# Patient Record
Sex: Female | Born: 1995 | Race: Black or African American | Hispanic: No | Marital: Single | Smoking: Never smoker
Health system: Southern US, Community
[De-identification: ages and names within clinical notes are randomized; demographics above are authoritative.]

## PROBLEM LIST (undated history)

## (undated) HISTORY — PX: FOOT SURGERY: SHX648

## (undated) HISTORY — PX: THERAPEUTIC ABORTION: SHX798

---
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
## 2013-02-11 ENCOUNTER — Ambulatory Visit: Payer: Self-pay | Admitting: Physician Assistant

## 2015-05-07 NOTE — L&D Delivery Note (Signed)
Delivery Note At  a viable and healthy female was delivered via  (Presentation:ROA   ).  APGAR:8 ,9 ; weight  pending.   Placenta status: intact, 3V Cord:  with the following complications:True knot in cord x2.  Thick mec .   Anesthesia:  Epideral Lacerations:  None Est. Blood Loss (mL): 300cc Uterus boggy, external massage given and 1065mcg Cytotec rectally.    Mom to postpartum.  Baby to Couplet care / Skin to Skin.  Destiny Stuart Yulissa Needham 03/08/2016, 10:29 AM

## 2015-08-22 LAB — OB RESULTS CONSOLE GC/CHLAMYDIA: Chlamydia: NEGATIVE

## 2015-08-29 LAB — OB RESULTS CONSOLE GC/CHLAMYDIA: Gonorrhea: NEGATIVE

## 2015-08-29 LAB — OB RESULTS CONSOLE HIV ANTIBODY (ROUTINE TESTING): HIV: NONREACTIVE

## 2015-08-29 LAB — OB RESULTS CONSOLE HEPATITIS B SURFACE ANTIGEN: Hepatitis B Surface Ag: NEGATIVE

## 2015-08-29 LAB — OB RESULTS CONSOLE RPR: RPR: NONREACTIVE

## 2015-08-29 LAB — OB RESULTS CONSOLE ABO/RH: RH Type: POSITIVE

## 2015-08-29 LAB — OB RESULTS CONSOLE ANTIBODY SCREEN: Antibody Screen: NEGATIVE

## 2016-02-01 LAB — OB RESULTS CONSOLE GBS: GBS: NEGATIVE

## 2016-03-01 ENCOUNTER — Telehealth (HOSPITAL_COMMUNITY): Payer: Self-pay | Admitting: *Deleted

## 2016-03-01 ENCOUNTER — Encounter (HOSPITAL_COMMUNITY): Payer: Self-pay | Admitting: *Deleted

## 2016-03-01 NOTE — Telephone Encounter (Signed)
Preadmission screen  

## 2016-03-04 ENCOUNTER — Encounter (HOSPITAL_COMMUNITY): Payer: Self-pay | Admitting: *Deleted

## 2016-03-04 ENCOUNTER — Telehealth (HOSPITAL_COMMUNITY): Payer: Self-pay | Admitting: *Deleted

## 2016-03-04 NOTE — Telephone Encounter (Signed)
Preadmission screen  

## 2016-03-07 ENCOUNTER — Encounter (HOSPITAL_COMMUNITY): Payer: Self-pay | Admitting: *Deleted

## 2016-03-07 ENCOUNTER — Inpatient Hospital Stay (HOSPITAL_COMMUNITY)
Admission: AD | Admit: 2016-03-07 | Discharge: 2016-03-10 | DRG: 775 | Disposition: A | Discharge status: Home / Self Care | Payer: Federal, State, Local not specified - PPO | Source: Ambulatory Visit | Attending: Obstetrics and Gynecology | Admitting: Obstetrics and Gynecology

## 2016-03-07 ENCOUNTER — Inpatient Hospital Stay (HOSPITAL_COMMUNITY): Payer: Federal, State, Local not specified - PPO | Admitting: Anesthesiology

## 2016-03-07 DIAGNOSIS — O480 Post-term pregnancy: Secondary | ICD-10-CM | POA: Diagnosis present

## 2016-03-07 DIAGNOSIS — O1414 Severe pre-eclampsia complicating childbirth: Secondary | ICD-10-CM | POA: Diagnosis present

## 2016-03-07 DIAGNOSIS — Z3A41 41 weeks gestation of pregnancy: Secondary | ICD-10-CM | POA: Diagnosis not present

## 2016-03-07 DIAGNOSIS — O1490 Unspecified pre-eclampsia, unspecified trimester: Secondary | ICD-10-CM | POA: Diagnosis present

## 2016-03-07 LAB — COMPREHENSIVE METABOLIC PANEL
ALBUMIN: 2.9 g/dL — AB (ref 3.5–5.0)
ALT: 6 U/L — ABNORMAL LOW (ref 14–54)
AST: 19 U/L (ref 15–41)
Alkaline Phosphatase: 55 U/L (ref 38–126)
Anion gap: 9 (ref 5–15)
BUN: 6 mg/dL (ref 6–20)
CHLORIDE: 105 mmol/L (ref 101–111)
CO2: 21 mmol/L — ABNORMAL LOW (ref 22–32)
Calcium: 8.8 mg/dL — ABNORMAL LOW (ref 8.9–10.3)
Creatinine, Ser: 0.65 mg/dL (ref 0.44–1.00)
GFR calc Af Amer: >60 mL/min (ref >60)
GLUCOSE: 101 mg/dL — AB (ref 65–99)
POTASSIUM: 3.5 mmol/L (ref 3.5–5.1)
Sodium: 135 mmol/L (ref 135–145)
Total Bilirubin: 0.7 mg/dL (ref 0.3–1.2)
Total Protein: 6.4 g/dL — ABNORMAL LOW (ref 6.5–8.1)

## 2016-03-07 LAB — RAPID URINE DRUG SCREEN, HOSP PERFORMED
Amphetamines: NOT DETECTED
Barbiturates: NOT DETECTED
Benzodiazepines: NOT DETECTED
COCAINE: NOT DETECTED
OPIATES: NOT DETECTED
Tetrahydrocannabinol: NOT DETECTED

## 2016-03-07 LAB — URINE MICROSCOPIC-ADD ON
RBC / HPF: NONE SEEN RBC/hpf (ref 0–5)
WBC UA: NONE SEEN WBC/hpf (ref 0–5)

## 2016-03-07 LAB — CBC
HCT: 30.6 % — ABNORMAL LOW (ref 36.0–46.0)
HCT: 31.2 % — ABNORMAL LOW (ref 36.0–46.0)
Hemoglobin: 10.1 g/dL — ABNORMAL LOW (ref 12.0–15.0)
Hemoglobin: 10.3 g/dL — ABNORMAL LOW (ref 12.0–15.0)
MCH: 25.4 pg — ABNORMAL LOW (ref 26.0–34.0)
MCH: 25.6 pg — ABNORMAL LOW (ref 26.0–34.0)
MCHC: 33 g/dL (ref 30.0–36.0)
MCHC: 33 g/dL (ref 30.0–36.0)
MCV: 77.1 fL — ABNORMAL LOW (ref 78.0–100.0)
MCV: 77.4 fL — AB (ref 78.0–100.0)
PLATELETS: 285 10*3/uL (ref 150–400)
Platelets: 319 10*3/uL (ref 150–400)
RBC: 3.97 MIL/uL (ref 3.87–5.11)
RBC: 4.03 MIL/uL (ref 3.87–5.11)
RDW: 13.9 % (ref 11.5–15.5)
RDW: 13.9 % (ref 11.5–15.5)
WBC: 15.3 10*3/uL — AB (ref 4.0–10.5)
WBC: 15.8 10*3/uL — AB (ref 4.0–10.5)

## 2016-03-07 LAB — URIC ACID: URIC ACID, SERUM: 5 mg/dL (ref 2.3–6.6)

## 2016-03-07 LAB — URINALYSIS, ROUTINE W REFLEX MICROSCOPIC
BILIRUBIN URINE: NEGATIVE
GLUCOSE, UA: NEGATIVE mg/dL
Ketones, ur: NEGATIVE mg/dL
Leukocytes, UA: NEGATIVE
Nitrite: NEGATIVE
PH: 7 (ref 5.0–8.0)
Protein, ur: NEGATIVE mg/dL

## 2016-03-07 LAB — TYPE AND SCREEN
ABO/RH(D): O POS
ANTIBODY SCREEN: NEGATIVE

## 2016-03-07 LAB — PROTEIN / CREATININE RATIO, URINE
Creatinine, Urine: 63 mg/dL
PROTEIN CREATININE RATIO: 0.1 mg/mg{creat} (ref 0.00–0.15)
Total Protein, Urine: 6 mg/dL

## 2016-03-07 LAB — OB RESULTS CONSOLE RUBELLA ANTIBODY, IGM: Rubella: IMMUNE

## 2016-03-07 LAB — ABO/RH: ABO/RH(D): O POS

## 2016-03-07 MED ORDER — FENTANYL CITRATE (PF) 100 MCG/2ML IJ SOLN
100.0000 ug | INTRAMUSCULAR | Status: DC | PRN
  Administered 2016-03-07: 100 ug via INTRAVENOUS
  Filled 2016-03-07: qty 2

## 2016-03-07 MED ORDER — EPHEDRINE 5 MG/ML INJ
10.0000 mg | INTRAVENOUS | Status: DC | PRN
  Filled 2016-03-07: qty 4

## 2016-03-07 MED ORDER — PHENYLEPHRINE 40 MCG/ML (10ML) SYRINGE FOR IV PUSH (FOR BLOOD PRESSURE SUPPORT)
80.0000 ug | PREFILLED_SYRINGE | INTRAVENOUS | Status: DC | PRN
Start: 2016-03-07 — End: 2016-03-08
  Filled 2016-03-07: qty 5
  Filled 2016-03-07: qty 10

## 2016-03-07 MED ORDER — OXYTOCIN BOLUS FROM INFUSION
500.0000 mL | Freq: Once | INTRAVENOUS | Status: AC
  Administered 2016-03-08: 500 mL via INTRAVENOUS

## 2016-03-07 MED ORDER — LACTATED RINGERS IV SOLN
500.0000 mL | Freq: Once | INTRAVENOUS | Status: AC
  Administered 2016-03-07: 500 mL via INTRAVENOUS

## 2016-03-07 MED ORDER — ONDANSETRON HCL 4 MG/2ML IJ SOLN: 4.0000 mg | Freq: Four times a day (QID) | INTRAMUSCULAR | Status: DC | PRN

## 2016-03-07 MED ORDER — SOD CITRATE-CITRIC ACID 500-334 MG/5ML PO SOLN: 30.0000 mL | ORAL | Status: DC | PRN

## 2016-03-07 MED ORDER — HYDRALAZINE HCL 20 MG/ML IJ SOLN
10.0000 mg | Freq: Once | INTRAMUSCULAR | Status: DC | PRN
  Filled 2016-03-07: qty 1

## 2016-03-07 MED ORDER — OXYTOCIN 40 UNITS IN LACTATED RINGERS INFUSION - SIMPLE MED
2.5000 [IU]/h | INTRAVENOUS | Status: DC
  Filled 2016-03-07: qty 1000

## 2016-03-07 MED ORDER — LACTATED RINGERS IV SOLN
INTRAVENOUS | Status: DC
  Administered 2016-03-07: 12:00:00 via INTRAVENOUS

## 2016-03-07 MED ORDER — LABETALOL HCL 5 MG/ML IV SOLN
20.0000 mg | INTRAVENOUS | Status: AC | PRN
  Administered 2016-03-07: 20 mg via INTRAVENOUS
  Administered 2016-03-07: 40 mg via INTRAVENOUS
  Administered 2016-03-08: 20 mg via INTRAVENOUS
  Filled 2016-03-07: qty 4
  Filled 2016-03-07: qty 12

## 2016-03-07 MED ORDER — MAGNESIUM SULFATE 50 % IJ SOLN
2.0000 g/h | INTRAVENOUS | Status: DC
  Administered 2016-03-08: 2 g/h via INTRAVENOUS
  Filled 2016-03-07 (×2): qty 80

## 2016-03-07 MED ORDER — TERBUTALINE SULFATE 1 MG/ML IJ SOLN: 0.2500 mg | Freq: Once | INTRAMUSCULAR | Status: DC | PRN

## 2016-03-07 MED ORDER — LACTATED RINGERS IV SOLN: 500.0000 mL | INTRAVENOUS | Status: DC | PRN

## 2016-03-07 MED ORDER — LABETALOL HCL 5 MG/ML IV SOLN
INTRAVENOUS | Status: AC
  Administered 2016-03-07: 20 mg via INTRAVENOUS
  Filled 2016-03-07: qty 16

## 2016-03-07 MED ORDER — OXYCODONE-ACETAMINOPHEN 5-325 MG PO TABS: 2.0000 | ORAL_TABLET | ORAL | Status: DC | PRN

## 2016-03-07 MED ORDER — MAGNESIUM SULFATE BOLUS VIA INFUSION
4.0000 g | Freq: Once | INTRAVENOUS | Status: AC
  Administered 2016-03-07: 4 g via INTRAVENOUS
  Filled 2016-03-07: qty 500

## 2016-03-07 MED ORDER — OXYCODONE-ACETAMINOPHEN 5-325 MG PO TABS
1.0000 | ORAL_TABLET | ORAL | Status: DC | PRN
  Filled 2016-03-07: qty 1

## 2016-03-07 MED ORDER — FENTANYL 2.5 MCG/ML BUPIVACAINE 1/10 % EPIDURAL INFUSION (WH - ANES)
14.0000 mL/h | INTRAMUSCULAR | Status: DC | PRN
  Administered 2016-03-07 – 2016-03-08 (×3): 14 mL/h via EPIDURAL
  Filled 2016-03-07 (×3): qty 100

## 2016-03-07 MED ORDER — LABETALOL HCL 5 MG/ML IV SOLN
20.0000 mg | INTRAVENOUS | Status: AC | PRN
Start: 2016-03-07 — End: 2016-03-07
  Administered 2016-03-07: 20 mg via INTRAVENOUS
  Administered 2016-03-07: 40 mg via INTRAVENOUS
  Administered 2016-03-07: 80 mg via INTRAVENOUS
  Filled 2016-03-07: qty 8
  Filled 2016-03-07: qty 16
  Filled 2016-03-07: qty 4

## 2016-03-07 MED ORDER — ACETAMINOPHEN 325 MG PO TABS
650.0000 mg | ORAL_TABLET | ORAL | Status: DC | PRN
  Administered 2016-03-07 (×2): 650 mg via ORAL
  Filled 2016-03-07 (×2): qty 2

## 2016-03-07 MED ORDER — DIPHENHYDRAMINE HCL 50 MG/ML IJ SOLN
12.5000 mg | INTRAMUSCULAR | Status: AC | PRN
  Administered 2016-03-07 – 2016-03-08 (×3): 12.5 mg via INTRAVENOUS
  Filled 2016-03-07: qty 1

## 2016-03-07 MED ORDER — HYDRALAZINE HCL 20 MG/ML IJ SOLN
10.0000 mg | Freq: Once | INTRAMUSCULAR | Status: AC | PRN
  Administered 2016-03-07: 10 mg via INTRAVENOUS
  Filled 2016-03-07: qty 1

## 2016-03-07 MED ORDER — LIDOCAINE HCL (PF) 1 % IJ SOLN
INTRAMUSCULAR | Status: DC | PRN
  Administered 2016-03-07: 4 mL
  Administered 2016-03-07: 6 mL via EPIDURAL

## 2016-03-07 MED ORDER — MISOPROSTOL 25 MCG QUARTER TABLET
25.0000 ug | ORAL_TABLET | ORAL | Status: DC | PRN
  Administered 2016-03-07: 25 ug via VAGINAL
  Filled 2016-03-07: qty 1
  Filled 2016-03-07: qty 0.25

## 2016-03-07 MED ORDER — LIDOCAINE HCL (PF) 1 % IJ SOLN
30.0000 mL | INTRAMUSCULAR | Status: DC | PRN
  Filled 2016-03-07: qty 30

## 2016-03-07 MED ORDER — PHENYLEPHRINE 40 MCG/ML (10ML) SYRINGE FOR IV PUSH (FOR BLOOD PRESSURE SUPPORT)
80.0000 ug | PREFILLED_SYRINGE | INTRAVENOUS | Status: DC | PRN
  Filled 2016-03-07: qty 10
  Filled 2016-03-07: qty 5

## 2016-03-07 MED ORDER — OXYCODONE-ACETAMINOPHEN 5-325 MG PO TABS
1.0000 | ORAL_TABLET | Freq: Once | ORAL | Status: AC
  Administered 2016-03-07: 1 via ORAL

## 2016-03-07 NOTE — MAU Note (Signed)
Pt presents to MAU with complaints of contractions that started around 2 this morning. Denies any vaginal bleeding or LOF. Reports baby is active

## 2016-03-07 NOTE — Anesthesia Procedure Notes (Signed)

## 2016-03-07 NOTE — Progress Notes (Signed)
S: received 1 dose of Cytotec, now contracting every 1-3 minutes, 40-50 seconds, 6/10  O: Required 3 doses of Labetalol and 1 dose of Hydralazine to get BP below parameters earlier and now BP rising again with pain      VE: 2+ / 80/ vertex/ -2  Very posterior      Fetal tracings: Category 1  A: Pre-eclampsia with severe features and all normal labs now in early labor  P: SVD expected     Continue current plan. Pain management

## 2016-03-07 NOTE — Anesthesia Preprocedure Evaluation (Signed)
Anesthesia Evaluation  Patient identified by MRN, date of birth, ID band Patient awake    Reviewed: Allergy & Precautions, H&P , Patient's Chart, lab work & pertinent test results  Airway Mallampati: II  TM Distance: >3 FB Neck ROM: full    Dental  (+) Teeth Intact   Pulmonary    breath sounds clear to auscultation       Cardiovascular hypertension,  Rhythm:regular Rate:Normal     Neuro/Psych    GI/Hepatic   Endo/Other    Renal/GU      Musculoskeletal   Abdominal   Peds  Hematology   Anesthesia Other Findings   PIH, Rx'd with labatolol and apresoline    Reproductive/Obstetrics (+) Pregnancy                             Anesthesia Physical Anesthesia Plan  ASA: II  Anesthesia Plan: Epidural   Post-op Pain Management:    Induction:   Airway Management Planned:   Additional Equipment:   Intra-op Plan:   Post-operative Plan:   Informed Consent: I have reviewed the patients History and Physical, chart, labs and discussed the procedure including the risks, benefits and alternatives for the proposed anesthesia with the patient or authorized representative who has indicated his/her understanding and acceptance.   Dental Advisory Given  Plan Discussed with:   Anesthesia Plan Comments: (Labs checked- platelets confirmed with RN in room. Fetal heart tracing, per RN, reported to be stable enough for sitting procedure. Discussed epidural, and patient consents to the procedure:  included risk of possible headache,backache, failed block, allergic reaction, and nerve injury. This patient was asked if she had any questions or concerns before the procedure started.)        Anesthesia Quick Evaluation

## 2016-03-07 NOTE — Anesthesia Pain Management Evaluation Note (Signed)
  CRNA Pain Management Visit Note  Patient: Destiny Stuart, 20 y.o., female  "Hello I am a member of the anesthesia team at [facility]. We have an anesthesia team available at all times to provide care throughout the hospital, including epidural management and anesthesia for C-section. I don't know your plan for the delivery whether it a natural birth, water birth, IV sedation, nitrous supplementation, doula or epidural, but we want to meet your pain goals."   1.Was your pain managed to your expectations on prior hospitalizations?   No prior hospitalizations  2.What is your expectation for pain management during this hospitalization?     Labor support without medications, Epidural, IV pain meds and Nitrous Oxide  3.How can we help you reach that goal? Pt first pregnancy. She is unsure what she wants tot use for pain control but was willing to discuss all options  Record the patient's initial score and the patient's pain goal.   Pain: 6  Pain Goal: 8 The [facility] wants you to be able to say your pain was always managed very well.  Nickson Middlesworth 03/07/2016

## 2016-03-07 NOTE — Progress Notes (Signed)
Dr Cletis Media notified of pt's VE, BP, contractions pattern and FHR tracing. Orders received to admit pt

## 2016-03-07 NOTE — H&P (Signed)
Destiny Stuart is a 20 y.o. female ,G1P0,presenting for labor check.Contractions are irregular. Cervical exam is still closed. BP elevated to severe range. Patient denies headache, visual symptoms and epigastric pain. She reports good FM.   Pregnancy followed at [facility] since 33+  Weeks, transferred of care from [place],  and remarkable for:  1. Reported cannabis use discontinued at +UPT with positive drug screen at initial OB labs 2. 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 EDD confirmed by early ultrasound 4. 20 week normal anatomy scan with posterior placenta  OB History    Gravida Para Term Preterm AB Living   1             SAB TAB Ectopic Multiple Live Births                 History reviewed. No pertinent past medical history. Past Surgical History:  Procedure Laterality Date  . FOOT SURGERY      Family History:   non-contributory  Social History:    reports that she has never smoked. She has never used smokeless tobacco. She reports that she does not drink alcohol or use drugs.   Prenatal labs: ABO, Rh: O/Positive/-- (04/25 0000) Antibody: Negative (04/25 0000) Rubella: immune RPR: Nonreactive (04/25 0000)  HBsAg: Negative (04/25 0000)  HIV: Non-reactive (04/25 0000)  GBS: Negative (09/28 0000)    Prenatal Transfer Tool  Maternal Diabetes: No Genetic Screening: Declined Maternal Ultrasounds/Referrals: Normal Fetal Ultrasounds or other Referrals:  None Maternal Substance Abuse:  Yes:  Type: Marijuana discontinued in early pregnancy Significant Maternal Medications:  None Significant Maternal Lab Results: None   Dilation: Closed Effacement (%): 50 Blood pressure (!) 176/105, pulse 117, temperature 98.4 F (36.9 C), resp. rate 18, height 5\' 8"  (1.727 m), weight 213 lb (96.6 kg), last menstrual period 05/25/2015.  General Appearance: Alert,  appropriate appearance for age. No acute distress HEENT Exam: Grossly normal Chest/Respiratory Exam: Normal chest wall and respirations. Clear to auscultation  Cardiovascular Exam: Regular rate and rhythm. S1, S2, no murmur Gastrointestinal Exam: soft, non-tender, Uterus gravid with size compatible with GA, Vertex presentation by Leopold's maneuvers Psychiatric Exam: Alert and oriented, appropriate affect Extremities: minimal edema, normal DTR and no clonus  ++++++++++++++++++++++++++++++++++++++++++++++++++++++++++++++++  Vaginal exam: c/50/-2 vertex  Fetal tracings: Category 1, contractions every 2-4 minutes, 5/10 intensity  ++++++++++++++++++++++++++++++++++++++++++++++++++++++++++++++++   Assessment/Plan:  41 weeks Severe pre-eclampsia Admit for IOL: cervical ripening with Cytotec Treat high range BP per protocol Start magnesium Sulfate All findings and plan of care were reviewed with patient and mother. Questions answered   Delsa Bern MD 03/07/2016, 11:16 AM

## 2016-03-07 NOTE — Progress Notes (Addendum)
Mattilynn I Alas is a 20 y.o. G1P0 at [redacted]w[redacted]d admitted for induction of labor due to Pre-eclampsia of pregnancy..  Subjective:  Comfortable with  Epidural Contractions every 1-2 minutes, lasting 45-60 seconds    Objective: BP (!) 152/92   Pulse 96   Temp 98.8 F (37.1 C) (Oral)   Resp 18   Ht 5\' 8"  (1.727 m)   Wt 213 lb (96.6 kg)   LMP 05/25/2015   SpO2 100%   BMI 32.39 kg/m  I/O last 3 completed shifts: In: 1815.4 [P.O.:420; I.V.:1395.4] Out: 2100 [Urine:2100] Total I/O In: 427.8 [P.O.:150; I.V.:250; Other:27.8] Out: 525 [Urine:525]  FHT:  FHR: 130 bpm, variability: moderate,  accelerations:  Abscent,  decelerations:   SVE:   Dilation: 3 Effacement (%): 80 Station: -2 Exam by:: Lori C. CNM  Labs: Lab Results  Component Value Date   WBC 15.8 (H) 03/07/2016   HGB 10.3 (L) 03/07/2016   HCT 31.2 (L) 03/07/2016   MCV 77.4 (L) 03/07/2016   PLT 319 03/07/2016    Assessment / Plan: PreEclampsia Induction of labor Magnesium Sulfate 2g/hr Arom- Thick mec IUPC placed Epidural Update to Dr  Nelda Marseille Fetal Wellbeing: reassuring Anticipated MOD:  NSVD  Lori A Clemmons 03/07/2016, 9:59 PM

## 2016-03-08 ENCOUNTER — Encounter (HOSPITAL_COMMUNITY): Payer: Self-pay | Admitting: *Deleted

## 2016-03-08 LAB — CBC
HEMATOCRIT: 29.5 % — AB (ref 36.0–46.0)
HEMOGLOBIN: 9.8 g/dL — AB (ref 12.0–15.0)
MCH: 25.9 pg — AB (ref 26.0–34.0)
MCHC: 33.2 g/dL (ref 30.0–36.0)
MCV: 77.8 fL — AB (ref 78.0–100.0)
PLATELETS: 295 10*3/uL (ref 150–400)
RBC: 3.79 MIL/uL — AB (ref 3.87–5.11)
RDW: 14.3 % (ref 11.5–15.5)
WBC: 19.7 10*3/uL — AB (ref 4.0–10.5)

## 2016-03-08 LAB — RPR: RPR Ser Ql: NONREACTIVE

## 2016-03-08 MED ORDER — ACETAMINOPHEN 325 MG PO TABS: 650.0000 mg | ORAL_TABLET | ORAL | Status: DC | PRN

## 2016-03-08 MED ORDER — ZOLPIDEM TARTRATE 5 MG PO TABS: 5.0000 mg | ORAL_TABLET | Freq: Every evening | ORAL | Status: DC | PRN

## 2016-03-08 MED ORDER — OXYTOCIN 40 UNITS IN LACTATED RINGERS INFUSION - SIMPLE MED
1.0000 m[IU]/min | INTRAVENOUS | Status: DC
  Administered 2016-03-08: 1 m[IU]/min via INTRAVENOUS
  Administered 2016-03-08: 7 m[IU]/min via INTRAVENOUS

## 2016-03-08 MED ORDER — TETANUS-DIPHTH-ACELL PERTUSSIS 5-2.5-18.5 LF-MCG/0.5 IM SUSP: 0.5000 mL | Freq: Once | INTRAMUSCULAR | Status: DC

## 2016-03-08 MED ORDER — MAGNESIUM SULFATE 50 % IJ SOLN
2.0000 g/h | INTRAVENOUS | Status: AC
  Administered 2016-03-08: 2 g/h via INTRAVENOUS
  Filled 2016-03-08: qty 80

## 2016-03-08 MED ORDER — ONDANSETRON HCL 4 MG PO TABS: 4.0000 mg | ORAL_TABLET | ORAL | Status: DC | PRN

## 2016-03-08 MED ORDER — IBUPROFEN 600 MG PO TABS
600.0000 mg | ORAL_TABLET | Freq: Four times a day (QID) | ORAL | Status: DC
  Administered 2016-03-08 – 2016-03-10 (×8): 600 mg via ORAL
  Filled 2016-03-08 (×8): qty 1

## 2016-03-08 MED ORDER — COCONUT OIL OIL: 1.0000 "application " | TOPICAL_OIL | Status: DC | PRN

## 2016-03-08 MED ORDER — DIPHENHYDRAMINE HCL 25 MG PO CAPS: 25.0000 mg | ORAL_CAPSULE | Freq: Four times a day (QID) | ORAL | Status: DC | PRN

## 2016-03-08 MED ORDER — LABETALOL HCL 5 MG/ML IV SOLN
20.0000 mg | INTRAVENOUS | Status: DC | PRN
  Administered 2016-03-08: 80 mg via INTRAVENOUS
  Administered 2016-03-08: 40 mg via INTRAVENOUS
  Filled 2016-03-08: qty 16

## 2016-03-08 MED ORDER — WITCH HAZEL-GLYCERIN EX PADS: 1.0000 "application " | MEDICATED_PAD | CUTANEOUS | Status: DC | PRN

## 2016-03-08 MED ORDER — SENNOSIDES-DOCUSATE SODIUM 8.6-50 MG PO TABS
2.0000 | ORAL_TABLET | ORAL | Status: DC
  Administered 2016-03-08 – 2016-03-10 (×3): 2 via ORAL
  Filled 2016-03-08 (×3): qty 2

## 2016-03-08 MED ORDER — HYDRALAZINE HCL 20 MG/ML IJ SOLN: 10.0000 mg | Freq: Once | INTRAMUSCULAR | Status: DC | PRN

## 2016-03-08 MED ORDER — PRENATAL MULTIVITAMIN CH
1.0000 | ORAL_TABLET | Freq: Every day | ORAL | Status: DC
  Administered 2016-03-09: 1 via ORAL
  Filled 2016-03-08: qty 1

## 2016-03-08 MED ORDER — LABETALOL HCL 5 MG/ML IV SOLN
INTRAVENOUS | Status: AC
  Administered 2016-03-08: 40 mg via INTRAVENOUS
  Filled 2016-03-08: qty 8

## 2016-03-08 MED ORDER — BENZOCAINE-MENTHOL 20-0.5 % EX AERO
1.0000 "application " | INHALATION_SPRAY | CUTANEOUS | Status: DC | PRN
  Administered 2016-03-08: 1 via TOPICAL
  Filled 2016-03-08: qty 56

## 2016-03-08 MED ORDER — MISOPROSTOL 200 MCG PO TABS
ORAL_TABLET | ORAL | Status: AC
  Filled 2016-03-08: qty 5

## 2016-03-08 MED ORDER — DIBUCAINE 1 % RE OINT: 1.0000 "application " | TOPICAL_OINTMENT | RECTAL | Status: DC | PRN

## 2016-03-08 MED ORDER — SIMETHICONE 80 MG PO CHEW
80.0000 mg | CHEWABLE_TABLET | ORAL | Status: DC | PRN
Start: 2016-03-08 — End: 2016-03-10

## 2016-03-08 MED ORDER — ONDANSETRON HCL 4 MG/2ML IJ SOLN: 4.0000 mg | INTRAMUSCULAR | Status: DC | PRN

## 2016-03-08 MED ORDER — MISOPROSTOL 200 MCG PO TABS
1000.0000 ug | ORAL_TABLET | Freq: Once | ORAL | Status: AC
  Administered 2016-03-08: 1000 ug via RECTAL

## 2016-03-08 NOTE — Lactation Note (Signed)
This note was copied from a baby's chart. Lactation Consultation Note  Patient Name: Destiny Stuart Today's Date: 03/08/2016 Reason for consult: Follow-up assessment  Baby 5 hours old. Mom had originally states that she wanted to pump and bottle-feed EBM--per mom's bedside RN, Lavella Lemons and [facility] note at initial consultation. Mom reports that her mother and grandmother helped her to latch the baby earlier, and she now wants to nurse in the hospital--and then nurse and pump at home. Offered to assist with latching baby and mom agreed. Assisted with latching baby to right breast in football position. Baby's temp is low normal per CN RN, Misty. Baby latched deeply to right breast and suckled rhythmically with a few swallows for several minutes. Demonstrated to mom how to stimulate baby to keep nurse. Baby nursed off-and-on for 10 minutes. Enc mom to keep offering STS and attempts at breast with cues. Enc mom to call out for assistance as needed. Discussed assessment and interventions with patient's RN, Lavella Lemons.  Maternal Data    Feeding Feeding Type: Breast Fed Length of feed:  (LC assessed first 10 minutes of BF.)  LATCH Score/Interventions Latch: Grasps breast easily, tongue down, lips flanged, rhythmical sucking.  Audible Swallowing: A few with stimulation Intervention(s): Skin to skin;Hand expression  Type of Nipple: Everted at rest and after stimulation (short shaft.)  Comfort (Breast/Nipple): Soft / non-tender     Hold (Positioning): Assistance needed to correctly position infant at breast and maintain latch. Intervention(s): Breastfeeding basics reviewed;Support Pillows;Position options;Skin to skin  LATCH Score: 8  Lactation Tools Discussed/Used     Consult Status Consult Status: Follow-up Date: 03/09/16 Follow-up type: In-patient    Andres Labrum 03/08/2016, 4:23 PM

## 2016-03-08 NOTE — Lactation Note (Signed)
This note was copied from a baby's chart. Lactation Consultation Note  Patient Name: Destiny Stuart Today's Date: 03/08/2016 Reason for consult: Initial assessment   Initial assessment with forst time mom of< 1 hour old infant.  Infant STS with mom and rooting to feed. Assisted mom in latching infant to both breasts without success. Mom with flat nipples and thick areola. Infant biting and has tongue pulled back in mouth when suckling on gloved finger. Hand expressed 1 cc Colostrum and spoon fed to infant. Mom asked me to stop BF and give a bottle. Enc mom to BF 8-12 x in 24 hours at first feeding cues and that BF should occur prior to formula feeding to establish a good supply. GM asked if mom could pump and bottle feed, told her yes and pump can be set up on Post Partum unit.   Feeding Log, BF Resources Handout and LC Brochure given, mom informed of IP/OP Services, BF Support Groups and [place] phone #. Enc mom to call out to desk for feeding assistance as needed. Follow up tomorrow and prn.    Maternal Data Formula Feeding for Exclusion: Yes Reason for exclusion: Mother's choice to formula and breast feed on admission Does the patient have breastfeeding experience prior to this delivery?: No  Feeding Feeding Type: Breast Fed Length of feed: 0 min  LATCH Score/Interventions Latch: Too sleepy or reluctant, no latch achieved, no sucking elicited. Intervention(s): Skin to skin  Audible Swallowing: None Intervention(s): Hand expression  Type of Nipple: Flat  Comfort (Breast/Nipple): Soft / non-tender     Hold (Positioning): Assistance needed to correctly position infant at breast and maintain latch. Intervention(s): Breastfeeding basics reviewed;Support Pillows;Position options;Skin to skin  LATCH Score: 4  Lactation Tools Discussed/Used WIC Program: No   Consult Status Consult Status: Follow-up Date: 03/08/16 Follow-up type: In-patient    Debby Freiberg Symphany Fleissner 03/08/2016,  11:37 AM

## 2016-03-09 ENCOUNTER — Inpatient Hospital Stay (HOSPITAL_COMMUNITY): Admission: RE | Admit: 2016-03-09 | Payer: Self-pay | Source: Ambulatory Visit

## 2016-03-09 LAB — CBC
HCT: 25.6 % — ABNORMAL LOW (ref 36.0–46.0)
Hemoglobin: 8.7 g/dL — ABNORMAL LOW (ref 12.0–15.0)
MCH: 26.3 pg (ref 26.0–34.0)
MCHC: 34 g/dL (ref 30.0–36.0)
MCV: 77.3 fL — ABNORMAL LOW (ref 78.0–100.0)
PLATELETS: 286 10*3/uL (ref 150–400)
RBC: 3.31 MIL/uL — ABNORMAL LOW (ref 3.87–5.11)
RDW: 14.5 % (ref 11.5–15.5)
WBC: 15.1 10*3/uL — ABNORMAL HIGH (ref 4.0–10.5)

## 2016-03-09 MED ORDER — NIFEDIPINE ER OSMOTIC RELEASE 30 MG PO TB24
60.0000 mg | ORAL_TABLET | Freq: Every day | ORAL | Status: DC
  Administered 2016-03-10: 60 mg via ORAL
  Filled 2016-03-09: qty 2

## 2016-03-09 MED ORDER — NIFEDIPINE ER OSMOTIC RELEASE 30 MG PO TB24
30.0000 mg | ORAL_TABLET | Freq: Once | ORAL | Status: AC
  Administered 2016-03-09: 30 mg via ORAL
  Filled 2016-03-09: qty 1

## 2016-03-09 MED ORDER — NIFEDIPINE ER OSMOTIC RELEASE 30 MG PO TB24
30.0000 mg | ORAL_TABLET | Freq: Every day | ORAL | Status: DC
  Administered 2016-03-09: 30 mg via ORAL
  Filled 2016-03-09: qty 1

## 2016-03-09 NOTE — Lactation Note (Signed)
This note was copied from a baby's chart. Lactation Consultation Note  Patient Name: Destiny Stuart Today's Date: 03/09/2016 Reason for consult: Follow-up assessment  Baby 71 hours old. Mom nursing baby in cradle position when this [place] entered the room. Baby latched deeply with lips flanged, suckling rhythmically with intermittent swallows noted. Mom reports that she re-latches baby if baby's latch is too shallow. Enc mom to continue nursing with cues, and discussed progression of milk coming to volume. Discussed engorgement prevention and treatment as well. Mom aware of OP/BFSG and [place] phone line assistance after D/C.   Maternal Data    Feeding Feeding Type: Breast Fed Length of feed: 25 min  LATCH Score/Interventions Latch: Grasps breast easily, tongue down, lips flanged, rhythmical sucking.  Audible Swallowing: Spontaneous and intermittent  Type of Nipple: Flat  Comfort (Breast/Nipple): Soft / non-tender     Hold (Positioning): No assistance needed to correctly position infant at breast.  LATCH Score: 9  Lactation Tools Discussed/Used     Consult Status Consult Status: Follow-up Date: 03/10/16 Follow-up type: In-patient    Andres Labrum 03/09/2016, 1:49 PM

## 2016-03-09 NOTE — Progress Notes (Signed)
Subjective: Postpartum Day 1: Vaginal delivery,  Patient up ad lib, reports no syncope or dizziness.  Magnesium will be turned off at 1030.  BP still 150/90s Feeding:  Breastfeeding Contraceptive plan:  undecided  Objective: Vital signs in last 24 hours: Temp:  [98 F (36.7 C)-98.9 F (37.2 C)] 98.4 F (36.9 C) (11/04 0927) Pulse Rate:  [88-111] 101 (11/04 0927) Resp:  [15-18] 18 (11/04 0927) BP: (114-169)/(87-131) 158/90 (11/04 0927) SpO2:  [98 %-100 %] 100 % (11/04 0927) Weight:  [95.8 kg (211 lb 3 oz)] 95.8 kg (211 lb 3 oz) (11/04 0525)  Physical Exam:  General: alert, cooperative and no distress Lochia: appropriate Uterine Fundus: firm Perineum: Intact DVT Evaluation: No evidence of DVT seen on physical exam. Negative Homan's sign.   CBC Latest Ref Rng & Units 03/09/2016 03/08/2016 03/07/2016  WBC 4.0 - 10.5 K/uL 15.1(H) 19.7(H) 15.8(H)  Hemoglobin 12.0 - 15.0 g/dL 8.7(L) 9.8(L) 10.3(L)  Hematocrit 36.0 - 46.0 % 25.6(L) 29.5(L) 31.2(L)  Platelets 150 - 400 K/uL 286 295 319     Assessment/Plan: Status post vaginal delivery day 1.  Preclampsia Stable Continue current care. Plan for discharge tomorrow and Breastfeeding  Will start on Procardia 30 XL    Pleas Koch ProtheroCNM 03/09/2016, 11:00 AM

## 2016-03-09 NOTE — Progress Notes (Signed)
Irene Shipper, CNM notified of BP 167/98.  Orders received to increase Procardia.  See orders.

## 2016-03-09 NOTE — Anesthesia Postprocedure Evaluation (Signed)
Anesthesia Post Note  Patient: Destiny Stuart  Procedure(s) Performed: * No procedures listed *  Patient location during evaluation: [facility] Unit Anesthesia Type: Epidural Level of consciousness: awake and alert, oriented and patient cooperative Pain management: pain level controlled Vital Signs Assessment: post-procedure vital signs reviewed and stable Respiratory status: spontaneous breathing Cardiovascular status: stable Postop Assessment: no headache, epidural receding, patient able to bend at knees and no signs of nausea or vomiting Anesthetic complications: no Comments: Stated pain score 1.     Last Vitals:  Vitals:   03/09/16 0155 03/09/16 0525  BP: (!) 153/90 (!) 158/87  Pulse: 88 (!) 101  Resp: 15 16  Temp: 36.7 C 37.2 C    Last Pain:  Vitals:   03/09/16 0525  TempSrc: Oral  PainSc:    Pain Goal: Patients Stated Pain Goal: 3 (03/09/16 0155)               Rico Sheehan

## 2016-03-10 MED ORDER — NIFEDIPINE ER OSMOTIC RELEASE 60 MG PO TB24
60.0000 mg | ORAL_TABLET | Freq: Every day | ORAL | 1 refills | Status: DC
Start: 2016-03-10 — End: 2018-10-14

## 2016-03-10 MED ORDER — PRENATAL MULTIVITAMIN CH: 1.0000 | ORAL_TABLET | Freq: Every day | ORAL | 1 refills | Status: DC

## 2016-03-10 NOTE — Discharge Summary (Signed)
OB Discharge Summary     Patient Name: Destiny Stuart DOB: 1995-12-10 MRN: [id number]  Date of admission: 03/07/2016 Delivering MD: Starla Link   Date of discharge: 03/10/2016  Admitting diagnosis: 41WKS,LABOR Intrauterine pregnancy: [redacted]w[redacted]d     Secondary diagnosis:  Active Problems:   Pre-eclampsia   SVD (spontaneous vaginal delivery)  Additional problems: None     Discharge diagnosis: Term Pregnancy Delivered and Preeclampsia (severe)                                                                                                Post partum procedures:Procardia 60mg  XL  Augmentation: AROM and Pitocin  Complications: None  Hospital course:  Induction of Labor With Vaginal Delivery   20 y.o. yo G1P1001 at [redacted]w[redacted]d was admitted to the hospital 03/07/2016 for induction of labor.  Indication for induction: Postdates and Preeclampsia.  Patient had an uncomplicated labor course as follows: Membrane Rupture Time/Date: 8:47 PM ,03/07/2016   Intrapartum Procedures: Episiotomy: None [1]                                         Lacerations:  None [1]  Patient had delivery of a Viable female infant.  Information for the patient's newborn:  Szymczak, Boy Niko [id number]  Delivery Method: Vag-Spont   03/08/2016  Details of delivery can be found in separate delivery note.  Patient had a routine postpartum course. Patient is discharged home 03/10/16.   Physical exam Vitals:   03/09/16 1754 03/09/16 2231 03/10/16 0110 03/10/16 0525  BP: (!) 167/98 (!) 157/89 (!) 158/92 140/85  Pulse: (!) 109 100 (!) 112 (!) 106  Resp: 18 18 18 18   Temp: 98.5 F (36.9 C) 98 F (36.7 C) 98.4 F (36.9 C) 98.3 F (36.8 C)  TempSrc: Oral Oral Oral Oral  SpO2: 100% 100% 99% 99%  Weight:    89.4 kg (197 lb)  Height:       General: alert, cooperative and no distress Lochia: appropriate Uterine Fundus: firm Incision: N/A DVT Evaluation: Negative Homan's sign. No significant calf/ankle  edema. Labs: Lab Results  Component Value Date   WBC 15.1 (H) 03/09/2016   HGB 8.7 (L) 03/09/2016   HCT 25.6 (L) 03/09/2016   MCV 77.3 (L) 03/09/2016   PLT 286 03/09/2016   CMP Latest Ref Rng & Units 03/07/2016  Glucose 65 - 99 mg/dL 101(H)  BUN 6 - 20 mg/dL 6  Creatinine 0.44 - 1.00 mg/dL 0.65  Sodium 135 - 145 mmol/L 135  Potassium 3.5 - 5.1 mmol/L 3.5  Chloride 101 - 111 mmol/L 105  CO2 22 - 32 mmol/L 21(L)  Calcium 8.9 - 10.3 mg/dL 8.8(L)  Total Protein 6.5 - 8.1 g/dL 6.4(L)  Total Bilirubin 0.3 - 1.2 mg/dL 0.7  Alkaline Phos 38 - 126 U/L 55  AST 15 - 41 U/L 19  ALT 14 - 54 U/L 6(L)    Discharge instruction: per After Visit Summary and "Baby and Me Booklet".  After visit meds:  Medication List    STOP taking these medications   pseudoephedrine 30 MG tablet Commonly known as:  SUDAFED     TAKE these medications   calcium carbonate 500 MG chewable tablet Commonly known as:  TUMS - dosed in mg elemental calcium Chew 3-4 tablets by mouth as needed for indigestion or heartburn.   clobetasol 0.05 % external solution Commonly known as:  TEMOVATE Apply 1 application topically 2 (two) times daily as needed (for irritation).   NIFEdipine 60 MG 24 hr tablet Commonly known as:  PROCARDIA XL/ADALAT-CC Take 1 tablet (60 mg total) by mouth daily.   prenatal multivitamin Tabs tablet Take 1 tablet by mouth daily at 12 noon.   triamcinolone ointment 0.1 % Commonly known as:  KENALOG Apply 1 application topically 2 (two) times daily as needed (for irritation).       Diet: routine diet  Activity: Advance as tolerated. Pelvic rest for 6 weeks.   Outpatient follow up:1 week for BP check Follow up Appt:No future appointments. Follow up Visit:1 week for BP check Postpartum contraception: Undecided  Newborn Data: Live born female  Birth Weight: 7 lb 12.7 oz (3535 g) APGAR: 9, 9  Baby Feeding: Breast Disposition:home with mother   03/10/2016 Starla Link,  CNM

## 2016-03-10 NOTE — Clinical Social Work Maternal (Signed)
CLINICAL SOCIAL WORK MATERNAL/CHILD NOTE  Patient Details  Name: Destiny Stuart MRN: [id number] Date of Birth: 10/14/1995  Date:  03/10/2016  Clinical Social Worker Initiating Note:  Valton Schwartz, MSW, LCSW-A   Date/ Time Initiated:  03/10/16/1038              Child's Name:  As'siyah Rison   Legal Guardian:  Other (Comment) (Not established by court system; MOB is single parenting)   Need for Interpreter:  None   Date of Referral:  03/08/16     Reason for Referral:  Current Substance Use/Substance Use During Pregnancy    Referral Source:  Physician   Address:  [address] [place], [place] [address]  Phone number:  [phone]   Household Members: Self, Parents   Natural Supports (not living in the home): Immediate Family   Professional Supports:None   Employment:Unemployed   Type of Work: Unemployed at the moment    Education:  9 to 11 years   Financial Resources:Medicaid, Private Insurance ([employer] employee PPO & Medicaid )   Other Resources:     Cultural/Religious Considerations Which May Impact Care: None stated at this time.   Strengths: Ability to meet basic needs , Compliance with medical plan , Home prepared for child    Risk Factors/Current Problems: Substance Use , Other (Comment) (MOB expressed to this writer feeling veyr overwhelmed and tired at the moment )   Cognitive State: Able to Concentrate , Alert , Insightful , Goal Oriented    Mood/Affect: Interested , Overwhelmed    CSW Assessment:CSW met with MOB at bedside to complete assessment. At this time, MOB had three visitors in the room in which she identified as her mother, father and grandmother. This writer asked guest to step out of room in order to provide MOB with privacy in responses to answers. Upon guest leaving the room, this writer noted to MOB her role and reasoning for visit being due to her hx of substance use. At this time, MOB  noted she would like to continue assessment in private.   MOB confirmed hx of substance use and notes her last use was probably in June. This writer informed MOB of hospitals policy and procedure regarding substance. This writer noted to MOB that a UDS and cord blood test have been ordered for baby; however, results have not come back yet. This writer explained to MOB that in the event either of these results are positive, a report will be made to [place] Department [facility] Social Services. MOB verbalized understanding.   This writer observed that MOB appeared to be very restricted in her responses to this writer engagement. This writer inquired how MOB is feeling emotionally and she noted "overwhelmed and tired. I have not slept since having him". This writer discussed PPD and SIDS and the importance of sleep and proper nutrition upon d/c. Additionally, this writer encouraged MOB to call on her supports when needed as the above mentioned feelings are all normal in regards to her present state after delivering. MOB noted she feels she has a good support system made up of the three individuals who are present today. MOB notes FOB is not involved.   This writer inquired if MOB has thought about child care in the event she finds employment. MOB notes she will work around her parents schedule when seeking employment in hopes that they will care for baby while she is working. This writer informed MOB of [place] Resources available that   can assist her in getting things in order for baby's arrival home and future needs. MOB noted she would like resources but no direct referrals at this time.   This writer provided MOB with resources for CC4C, Parenting 0-5 and Healthy Start Program. MOB was very receptive to all resources. At this time, no other needs addressed or requested. CSW will continue to follow baby's pending UDS and cord blood test results.   CSW Plan/Description: Information/Referral to  Community Resources    Joselynne Killam, MSW, LCSW-A Clinical Social Worker  [facility]  Office: [phone]   

## 2016-03-10 NOTE — Lactation Note (Signed)
This note was copied from a baby's chart. Lactation Consultation Note  Patient Name: [name] Today's Date: 03/10/2016 Reason for consult: Follow-up assessment  Visited with Mom and GMOB on day of discharge, baby 67 hrs old.  Baby at 4.6% weight loss, 4 voids, 0 stools in last 24 hrs (5 stools first 24 hrs). Baby crying and picture person in room.   Offered to assist with positioning and support.  GMOB stated that baby is always wanting to BF.  Explained about normal newborn feeding patterns and importance of offering breast if baby is fussing.   Mom latched baby in cross cradle hold easily.  Showed Mom how to un-tuck lower lip to help with a deeper areolar grasp.  Multiple swallowing identified.  Breasts with some fullness in upper outer quadrant.  Demonstrated how to use breast massage during feeding to help these areas soften.  Engorgement prevention and treatment discussed.  Encouraged STS and cue based feedings with a goal of 8-12 feedings per 24 hrs.  Reviewed normal cluster pattern of baby's feedings. Reminded Mom of OP lactation services provided.  Encouraged Mom to call for any questions.   Consult Status Consult Status: Complete Date: 03/10/16 Follow-up type: Call as needed    Destiny Stuart 03/10/2016, 9:56 AM

## 2016-03-10 NOTE — Progress Notes (Signed)
Discharge instructions reviewed with patient.  Patient states understanding of home care for self and baby, medications, activity, signs/symptoms to report to MD and return MD office visit for both.  Patients significant other and family will assist with her care @ home.  No home  equipment needed, patient has prescriptions and all personal belongings.  Patient ambulated for discharge in stable condition with staff without incident.  Baby discharged into care of mother.

## 2016-03-14 NOTE — Progress Notes (Signed)
CSW made a report to [facility] [place] worker, Wendall Stade, for infant's positive cord for [facility].  CPS will follow up with MOB.   Laurey Arrow, MSW, LCSW Clinical Social Work [phone]

## 2016-05-27 ENCOUNTER — Encounter: Payer: Self-pay | Admitting: Emergency Medicine

## 2016-05-27 ENCOUNTER — Observation Stay (HOSPITAL_COMMUNITY)
Admission: EM | Admit: 2016-05-27 | Discharge: 2016-05-30 | Disposition: A | Discharge status: Home / Self Care | Payer: Federal, State, Local not specified - PPO | Attending: Family Medicine | Admitting: Family Medicine

## 2016-05-27 ENCOUNTER — Emergency Department (HOSPITAL_COMMUNITY): Payer: Federal, State, Local not specified - PPO

## 2016-05-27 DIAGNOSIS — K8020 Calculus of gallbladder without cholecystitis without obstruction: Secondary | ICD-10-CM

## 2016-05-27 DIAGNOSIS — D75839 Thrombocytosis, unspecified: Secondary | ICD-10-CM

## 2016-05-27 DIAGNOSIS — R748 Abnormal levels of other serum enzymes: Secondary | ICD-10-CM | POA: Diagnosis present

## 2016-05-27 DIAGNOSIS — O1493 Unspecified pre-eclampsia, third trimester: Secondary | ICD-10-CM | POA: Diagnosis not present

## 2016-05-27 DIAGNOSIS — K838 Other specified diseases of biliary tract: Secondary | ICD-10-CM | POA: Diagnosis present

## 2016-05-27 DIAGNOSIS — E049 Nontoxic goiter, unspecified: Secondary | ICD-10-CM | POA: Insufficient documentation

## 2016-05-27 DIAGNOSIS — D473 Essential (hemorrhagic) thrombocythemia: Secondary | ICD-10-CM | POA: Diagnosis not present

## 2016-05-27 DIAGNOSIS — R7303 Prediabetes: Secondary | ICD-10-CM | POA: Diagnosis not present

## 2016-05-27 DIAGNOSIS — K8080 Other cholelithiasis without obstruction: Secondary | ICD-10-CM

## 2016-05-27 DIAGNOSIS — R1011 Right upper quadrant pain: Secondary | ICD-10-CM | POA: Diagnosis present

## 2016-05-27 DIAGNOSIS — O1490 Unspecified pre-eclampsia, unspecified trimester: Secondary | ICD-10-CM | POA: Diagnosis present

## 2016-05-27 DIAGNOSIS — K8064 Calculus of gallbladder and bile duct with chronic cholecystitis without obstruction: Principal | ICD-10-CM | POA: Insufficient documentation

## 2016-05-27 DIAGNOSIS — K8050 Calculus of bile duct without cholangitis or cholecystitis without obstruction: Secondary | ICD-10-CM

## 2016-05-27 LAB — URINALYSIS, ROUTINE W REFLEX MICROSCOPIC
BACTERIA UA: NONE SEEN
GLUCOSE, UA: NEGATIVE mg/dL
HGB URINE DIPSTICK: NEGATIVE
Ketones, ur: 5 mg/dL — AB
LEUKOCYTES UA: NEGATIVE
NITRITE: NEGATIVE
PROTEIN: 30 mg/dL — AB
Specific Gravity, Urine: 1.023 (ref 1.005–1.030)
pH: 6 (ref 5.0–8.0)

## 2016-05-27 LAB — CBC
HEMATOCRIT: 37.6 % (ref 36.0–46.0)
HEMOGLOBIN: 11.6 g/dL — AB (ref 12.0–15.0)
MCH: 22.4 pg — ABNORMAL LOW (ref 26.0–34.0)
MCHC: 30.9 g/dL (ref 30.0–36.0)
MCV: 72.6 fL — ABNORMAL LOW (ref 78.0–100.0)
Platelets: 410 10*3/uL — ABNORMAL HIGH (ref 150–400)
RBC: 5.18 MIL/uL — AB (ref 3.87–5.11)
RDW: 15.5 % (ref 11.5–15.5)
WBC: 9.2 10*3/uL (ref 4.0–10.5)

## 2016-05-27 LAB — COMPREHENSIVE METABOLIC PANEL
ALBUMIN: 4.3 g/dL (ref 3.5–5.0)
ALT: 293 U/L — ABNORMAL HIGH (ref 14–54)
ANION GAP: 11 (ref 5–15)
AST: 485 U/L — ABNORMAL HIGH (ref 15–41)
Alkaline Phosphatase: 153 U/L — ABNORMAL HIGH (ref 38–126)
BILIRUBIN TOTAL: 2.2 mg/dL — AB (ref 0.3–1.2)
BUN: 7 mg/dL (ref 6–20)
CO2: 28 mmol/L (ref 22–32)
Calcium: 9.9 mg/dL (ref 8.9–10.3)
Chloride: 101 mmol/L (ref 101–111)
Creatinine, Ser: 0.99 mg/dL (ref 0.44–1.00)
GFR calc non Af Amer: >60 mL/min (ref >60)
GLUCOSE: 115 mg/dL — AB (ref 65–99)
POTASSIUM: 3.6 mmol/L (ref 3.5–5.1)
Sodium: 140 mmol/L (ref 135–145)
TOTAL PROTEIN: 8 g/dL (ref 6.5–8.1)

## 2016-05-27 LAB — LIPASE, BLOOD: Lipase: 21 U/L (ref 11–51)

## 2016-05-27 MED ORDER — ONDANSETRON HCL 4 MG PO TABS
4.0000 mg | ORAL_TABLET | Freq: Four times a day (QID) | ORAL | Status: DC | PRN
Start: 2016-05-27 — End: 2016-05-30

## 2016-05-27 MED ORDER — ACETAMINOPHEN 650 MG RE SUPP: 650.0000 mg | Freq: Four times a day (QID) | RECTAL | Status: DC | PRN

## 2016-05-27 MED ORDER — FENTANYL CITRATE (PF) 100 MCG/2ML IJ SOLN
25.0000 ug | INTRAMUSCULAR | Status: DC | PRN
  Administered 2016-05-28: 100 ug via INTRAVENOUS
  Administered 2016-05-29 (×3): 25 ug via INTRAVENOUS
  Filled 2016-05-27 (×3): qty 2

## 2016-05-27 MED ORDER — ENOXAPARIN SODIUM 40 MG/0.4ML ~~LOC~~ SOLN: 40.0000 mg | Freq: Once | SUBCUTANEOUS | Status: DC

## 2016-05-27 MED ORDER — SODIUM CHLORIDE 0.9 % IV SOLN
INTRAVENOUS | Status: DC
  Administered 2016-05-27 – 2016-05-29 (×5): via INTRAVENOUS

## 2016-05-27 MED ORDER — PRENATAL MULTIVITAMIN CH
1.0000 | ORAL_TABLET | Freq: Every day | ORAL | Status: DC
  Filled 2016-05-27 (×3): qty 1

## 2016-05-27 MED ORDER — ACETAMINOPHEN 325 MG PO TABS: 650.0000 mg | ORAL_TABLET | Freq: Four times a day (QID) | ORAL | Status: DC | PRN

## 2016-05-27 MED ORDER — HEPARIN SODIUM (PORCINE) 5000 UNIT/ML IJ SOLN
5000.0000 [IU] | Freq: Three times a day (TID) | INTRAMUSCULAR | Status: AC
  Administered 2016-05-27: 5000 [IU] via SUBCUTANEOUS
  Filled 2016-05-27: qty 1

## 2016-05-27 MED ORDER — HYDRALAZINE HCL 20 MG/ML IJ SOLN: 10.0000 mg | Freq: Four times a day (QID) | INTRAMUSCULAR | Status: DC | PRN

## 2016-05-27 MED ORDER — KETOROLAC TROMETHAMINE 15 MG/ML IJ SOLN: 15.0000 mg | Freq: Four times a day (QID) | INTRAMUSCULAR | Status: DC | PRN

## 2016-05-27 MED ORDER — POLYETHYLENE GLYCOL 3350 17 G PO PACK: 17.0000 g | PACK | Freq: Every day | ORAL | Status: DC | PRN

## 2016-05-27 MED ORDER — ONDANSETRON HCL 4 MG/2ML IJ SOLN
4.0000 mg | Freq: Four times a day (QID) | INTRAMUSCULAR | Status: DC | PRN
  Filled 2016-05-27: qty 2

## 2016-05-27 NOTE — Consult Note (Signed)
Unassigned patient Reason for Consult: Abnormal LFT's with gallstones. Referring Physician: THP  Destiny Stuart is an 21 y.o. female.  HPI:  Destiny Stuart is a 21 year old black female who presented to the [facility] Emergency room with a two-week history of epigastric pain nausea and vomiting. She is 3 months postpartum. Over the last 2 weeks she been having worsening epigastric pain radiating to her back. She's had some anorexia as well she denies a history of hematemesis melena or melena. There is no history of fever chills or rigors. In the ER she was found to have a 10.1 mm common bile duct with cholelithiasis on an abdominal ultrasound. Her total bilirubin is 2.2 with elevated AST/ALT and Alkaline phosphatase. There is no evidence of cholecystitis. She is otherwise healthy she had some problems with preeclampsia during her pregnancy.     History reviewed. No pertinent past medical history.  Past Surgical History:  Procedure Laterality Date  . FOOT SURGERY     No family history on file.  Social History:  reports that she has never smoked. She has never used smokeless tobacco. She reports that she does not drink alcohol or use drugs. She is single and works for [employer] she just started her job recently   Allergies:  Allergies  Allergen Reactions  . Iodine Hives  . Latex Hives  . Peanut-Containing Drug Products Hives   Medications: I have reviewed the patient's current medications.  Results for orders placed or performed during the hospital encounter of 05/27/16 (from the past 48 hour(s))  Lipase, blood     Status: None   Collection Time: 05/27/16  7:48 AM  Result Value Ref Range   Lipase 21 11 - 51 U/L  Comprehensive metabolic panel     Status: Abnormal   Collection Time: 05/27/16  7:48 AM  Result Value Ref Range   Sodium 140 135 - 145 mmol/L   Potassium 3.6 3.5 - 5.1 mmol/L   Chloride 101 101 - 111 mmol/L   CO2 28 22 - 32 mmol/L   Glucose, Bld 115 (H) 65 - 99  mg/dL   BUN 7 6 - 20 mg/dL   Creatinine, Ser 0.99 0.44 - 1.00 mg/dL   Calcium 9.9 8.9 - 10.3 mg/dL   Total Protein 8.0 6.5 - 8.1 g/dL   Albumin 4.3 3.5 - 5.0 g/dL   AST 485 (H) 15 - 41 U/L   ALT 293 (H) 14 - 54 U/L   Alkaline Phosphatase 153 (H) 38 - 126 U/L   Total Bilirubin 2.2 (H) 0.3 - 1.2 mg/dL   GFR calc non Af Amer >60 >60 mL/min   GFR calc Af Amer >60 >60 mL/min    Comment: (NOTE) The eGFR has been calculated using the CKD EPI equation. This calculation has not been validated in all clinical situations. eGFR's persistently <60 mL/min signify possible Chronic Kidney Disease.    Anion gap 11 5 - 15  CBC     Status: Abnormal   Collection Time: 05/27/16  7:48 AM  Result Value Ref Range   WBC 9.2 4.0 - 10.5 K/uL   RBC 5.18 (H) 3.87 - 5.11 MIL/uL   Hemoglobin 11.6 (L) 12.0 - 15.0 g/dL   HCT 37.6 36.0 - 46.0 %   MCV 72.6 (L) 78.0 - 100.0 fL   MCH 22.4 (L) 26.0 - 34.0 pg   MCHC 30.9 30.0 - 36.0 g/dL   RDW 15.5 11.5 - 15.5 %   Platelets 410 (H) 150 -  400 K/uL  Urinalysis, Routine w reflex microscopic     Status: Abnormal   Collection Time: 05/27/16  7:53 AM  Result Value Ref Range   Color, Urine AMBER (A) YELLOW    Comment: BIOCHEMICALS MAY BE AFFECTED BY COLOR   APPearance CLEAR CLEAR   Specific Gravity, Urine 1.023 1.005 - 1.030   pH 6.0 5.0 - 8.0   Glucose, UA NEGATIVE NEGATIVE mg/dL   Hgb urine dipstick NEGATIVE NEGATIVE   Bilirubin Urine MODERATE (A) NEGATIVE   Ketones, ur 5 (A) NEGATIVE mg/dL   Protein, ur 30 (A) NEGATIVE mg/dL   Nitrite NEGATIVE NEGATIVE   Leukocytes, UA NEGATIVE NEGATIVE   RBC / HPF 0-5 0 - 5 RBC/hpf   WBC, UA 0-5 0 - 5 WBC/hpf   Bacteria, UA NONE SEEN NONE SEEN   Squamous Epithelial / LPF 0-5 (A) NONE SEEN   Mucous PRESENT    US Abdomen Limited Ruq  Result Date: 05/27/2016 CLINICAL DATA:  Right upper quadrant abdominal pain, nausea, vomiting and diarrhea for the past 3 weeks. EXAM: US ABDOMEN LIMITED - RIGHT UPPER QUADRANT COMPARISON:   None. FINDINGS: Gallbladder: Multiple gallstones in the gallbladder measuring up to 8 mm in maximum diameter each. No gallbladder wall thickening or pericholecystic fluid. No sonographic Murphy's sign. Common bile duct: Diameter: 10.1 mm Liver: No focal lesion identified. Within normal limits in parenchymal echogenicity. IMPRESSION: 1. Cholelithiasis without evidence of cholecystitis. 2. Dilated common duct. This is suspicious for a nonvisualized distal common duct stone or stones. A nonvisualized mass or stricture is less likely. Electronically Signed   By: Claudie Revering M.D.   On: 05/27/2016 12:56    Review of Systems  Constitutional: Negative.   HENT: Negative.   Eyes: Negative.   Respiratory: Negative.   Cardiovascular: Negative.   Gastrointestinal: Positive for abdominal pain, nausea and vomiting. Negative for blood in stool, constipation, diarrhea, heartburn and melena.  Genitourinary: Negative.   Musculoskeletal: Negative.   Skin: Negative.   Neurological: Negative.   Endo/Heme/Allergies: Negative.   Psychiatric/Behavioral: Negative.    Blood pressure 130/84, pulse 101, temperature 98 F (36.7 C), temperature source Oral, resp. rate 16, height 5' 8"  (1.727 m), weight 83.6 kg (184 lb 6 oz), last menstrual period 05/27/2016, SpO2 100 %, not currently breastfeeding. Physical Exam  Constitutional: She is oriented to person, place, and time. She appears well-developed and well-nourished.  HENT:  Head: Normocephalic and atraumatic.  Eyes: Conjunctivae and EOM are normal. Pupils are equal, round, and reactive to light.  Neck: Normal range of motion. Neck supple.  Cardiovascular: Normal rate and regular rhythm.   Respiratory: Effort normal and breath sounds normal.  GI: Soft. Bowel sounds are normal. She exhibits no mass. There is tenderness. There is no rebound and no guarding.  Musculoskeletal: Normal range of motion.  Neurological: She is alert and oriented to person, place, and time.   Skin: Skin is warm and dry.  Psychiatric: She has a normal mood and affect. Her behavior is normal. Judgment and thought content normal.   Assessment/Plan: 1) Cholelithiasis with a dilated common bile duct and abnormal LFTs- AN ERCP is planned for tomorrow appreciate surgical consultation for the recommendation made thereafter.   Starleen Trussell 05/27/2016, 3:40 PM

## 2016-05-27 NOTE — ED Provider Notes (Signed)
[facility] DEPT Provider Note   CSN: [id number] Arrival date & time: 05/27/16  [id number]     History   Chief Complaint No chief complaint on file.   HPI Atavia I Klemmer is a 21 y.o. female.  HPI   Pt with PMH of SVDelivery November 2016 comes to the ER for ruq/epigastric abdominal pain. The pain has been intermittent, waxing and waning at times severe. The pain that brought her to the ER this morning was severe and she reports that she was crying. She denies ETOH intake, denies. Tylenol or trying to use any medications at all. She has been having some associated vomiting. These episodes have been going on for 2-3 weeks. Currently she is only having mild discomfort and declines pain medications. Deneis hx of gallbladder/pancreatic disease or this happening to her in the past.  History reviewed. No pertinent past medical history.  Patient Active Problem List   Diagnosis Date Noted  . Elevated liver enzymes 05/27/2016  . Dilated bile duct 05/27/2016  . SVD (spontaneous vaginal delivery) 03/08/2016  . Pre-eclampsia 03/07/2016  . Pre-diabetes 10/22/2010  . Goiter 10/22/2010    Past Surgical History:  Procedure Laterality Date  . FOOT SURGERY      OB History    Gravida Para Term Preterm AB Living   1 1 1     1    SAB TAB Ectopic Multiple Live Births         0 1       Home Medications    Prior to Admission medications   Medication Sig Start Date End Date Taking? Authorizing Provider  calcium carbonate (TUMS - DOSED IN MG ELEMENTAL CALCIUM) 500 MG chewable tablet Chew 3-4 tablets by mouth as needed for indigestion or heartburn.    Historical Provider, MD  clobetasol (TEMOVATE) 0.05 % external solution Apply 1 application topically 2 (two) times daily as needed (for irritation).     Historical Provider, MD  NIFEdipine (PROCARDIA XL/ADALAT-CC) 60 MG 24 hr tablet Take 1 tablet (60 mg total) by mouth daily. 03/10/16   Starla Link, CNM  Prenatal Vit-Fe Fumarate-FA  (PRENATAL MULTIVITAMIN) TABS tablet Take 1 tablet by mouth daily at 12 noon. 03/10/16   Starla Link, CNM  triamcinolone ointment (KENALOG) 0.1 % Apply 1 application topically 2 (two) times daily as needed (for irritation).    Historical Provider, MD    Family History No family history on file.  Social History Social History  Substance Use Topics  . Smoking status: Never Smoker  . Smokeless tobacco: Never Used  . Alcohol use No     Allergies   Iodine; Latex; and Peanut-containing drug products   Review of Systems Review of Systems Review of Systems All other systems negative except as documented in the HPI. All pertinent positives and negatives as reviewed in the HPI.   Physical Exam Updated Vital Signs BP 124/83 (BP Location: Left Arm)   Pulse 93   Temp 98.4 F (36.9 C) (Oral)   Resp 18   Ht 5\' 8"  (1.727 m)   Wt 83.6 kg   LMP 05/27/2016 (Approximate)   SpO2 100%   Breastfeeding? No   BMI 28.03 kg/m   Physical Exam  Constitutional: She appears well-developed and well-nourished. No distress.  HENT:  Head: Normocephalic and atraumatic.  Right Ear: Tympanic membrane and ear canal normal.  Left Ear: Tympanic membrane and ear canal normal.  Nose: Nose normal.  Mouth/Throat: Uvula is midline, oropharynx is clear and moist and mucous  membranes are normal.  Eyes: Pupils are equal, round, and reactive to light.  Neck: Normal range of motion. Neck supple.  Cardiovascular: Normal rate and regular rhythm.   Pulmonary/Chest: Effort normal.  Abdominal: Soft. She exhibits no distension. There is tenderness (mild) in the right upper quadrant and epigastric area. There is no rigidity, no rebound, no guarding and negative Murphy's sign.  No signs of abdominal distention  Musculoskeletal:  No LE swelling  Neurological: She is alert.  Acting at baseline  Skin: Skin is warm and dry. No rash noted.  Nursing note and vitals reviewed.    ED Treatments / Results   Labs (all labs ordered are listed, but only abnormal results are displayed) Labs Reviewed  COMPREHENSIVE METABOLIC PANEL - Abnormal; Notable for the following:       Result Value   Glucose, Bld 115 (*)    AST 485 (*)    ALT 293 (*)    Alkaline Phosphatase 153 (*)    Total Bilirubin 2.2 (*)    All other components within normal limits  CBC - Abnormal; Notable for the following:    RBC 5.18 (*)    Hemoglobin 11.6 (*)    MCV 72.6 (*)    MCH 22.4 (*)    Platelets 410 (*)    All other components within normal limits  URINALYSIS, ROUTINE W REFLEX MICROSCOPIC - Abnormal; Notable for the following:    Color, Urine AMBER (*)    Bilirubin Urine MODERATE (*)    Ketones, ur 5 (*)    Protein, ur 30 (*)    Squamous Epithelial / LPF 0-5 (*)    All other components within normal limits  LIPASE, BLOOD    EKG  EKG Interpretation None       Radiology US Abdomen Limited Ruq  Result Date: 05/27/2016 CLINICAL DATA:  Right upper quadrant abdominal pain, nausea, vomiting and diarrhea for the past 3 weeks. EXAM: US ABDOMEN LIMITED - RIGHT UPPER QUADRANT COMPARISON:  None. FINDINGS: Gallbladder: Multiple gallstones in the gallbladder measuring up to 8 mm in maximum diameter each. No gallbladder wall thickening or pericholecystic fluid. No sonographic Murphy's sign. Common bile duct: Diameter: 10.1 mm Liver: No focal lesion identified. Within normal limits in parenchymal echogenicity. IMPRESSION: 1. Cholelithiasis without evidence of cholecystitis. 2. Dilated common duct. This is suspicious for a nonvisualized distal common duct stone or stones. A nonvisualized mass or stricture is less likely. Electronically Signed   By: Claudie Revering M.D.   On: 05/27/2016 12:56    Procedures Procedures (including critical care time)  Medications Ordered in ED Medications - No data to display   Initial Impression / Assessment and Plan / ED Course  I have reviewed the triage vital signs and the nursing  notes.  Pertinent labs & imaging results that were available during my care of the patient were reviewed by me and considered in my medical decision making (see chart for details).  Case discussed with Dr. Regenia Skeeter, concern of abnormal liver enzymes and possible stone retained in duct, + cholelithiasis.   Dr. Collene Mares - Gastroenterology will do ERCP in the morning, recommend hospitalist to admit and to consult GenSurg. Magdalene River, will consult surgery.  Discussed results and plan with the patient. She understands that she needs admission for further testing and may end up needing gallbladder removed.  Admit to [facility], [place] admits, obs, Medsurg  Final Clinical Impressions(s) / ED Diagnoses   Final diagnoses:  Right upper quadrant pain  New Prescriptions New Prescriptions   No medications on file     Delos Haring, Hershal Coria 05/27/16 [place], MD 05/31/16 2224

## 2016-05-27 NOTE — ED Triage Notes (Signed)
Pt reports intermitting n/v/d x 3 wks, pt reports x2 vomiting episodes in the last 24 hrs, pt denies recent liquid stools, pt c/o abd pain bil upper abd today, pt reports pain moving locations, pt A&Ox4

## 2016-05-27 NOTE — Consult Note (Signed)
[facility] Surgery Consult Note  Destiny Stuart [birth year]  [id number].    Requesting MD: Regenia Skeeter, MD Chief Complaint/Reason for Consult: cholelithiasis, abnormal LFT's  HPI:  21 year-old female, about 3 months postpartum, who presented to [facility] with epigastric abdominal pain. Pain started 2 weeks ago. described as sharp, intermittent and radiating through her back. Associated with nausea, anorexia, and vomiting. Denies hematemesis. Reports one episode of diarrhea on Friday. Denies a known history of gallstones. Has no history of abdominal surgeries. She denies tobacco use or illicit drug use. Denies use of blood thinning medications. She is not breastfeeding.   ED workup:  RUQ U/S multiple gallstones, no wall thickening or pericholecystic fluid. Negative murphy's. CBD 10.1 mm. AST 485, ALT 293, Alk Phos 153, T.bili 2.2  WBC and lipase are WNL  ROS: Review of Systems  Constitutional: Negative for chills, fever and weight loss.  Respiratory: Negative for cough, hemoptysis, sputum production and shortness of breath.   Cardiovascular: Negative for chest pain and palpitations.  Gastrointestinal: Positive for abdominal pain, diarrhea, nausea and vomiting. Negative for blood in stool, constipation, heartburn and melena.  Genitourinary: Negative for dysuria and frequency.  All other systems reviewed and are negative.  No family history on file.  History reviewed. No pertinent past medical history.  Past Surgical History:  Procedure Laterality Date  . FOOT SURGERY      Social History:  reports that she has never smoked. She has never used smokeless tobacco. She reports that she does not drink alcohol or use drugs.  Allergies:  Allergies  Allergen Reactions  . Iodine Hives  . Latex Hives  . Peanut-Containing Drug Products Hives     (Not in a hospital admission)  Blood pressure 130/84, pulse 101, temperature 98 F (36.7 C), temperature source Oral, resp. rate 16, height  _0  (1.727 m), weight 83.6 kg (184 lb 6 oz), last menstrual period 05/27/2016, SpO2 100 %, not currently breastfeeding. Physical Exam: General: pleasant, overweight AA female who is laying in bed in NAD HEENT: head is normocephalic, atraumatic.  Sclera are noninjected.  PERRL.  Ears and nose without any masses or lesions.  Mouth is pink and moist Heart: regular, rate, and rhythm.  No obvious murmurs, gallops, or rubs noted.  Palpable pedal pulses bilaterally Lungs: CTAB, no wheezes, rhonchi, or rales noted.  Respiratory effort nonlabored Abd: soft, mild TTP epigastrium without peritonitis or guarding, ND, +BS, no masses, hernias, or organomegaly. +abdominal striae  MS: all 4 extremities are symmetrical with no cyanosis, clubbing, or edema. Skin: warm and dry with no masses, lesions, or rashes Psych: A&Ox3 with an appropriate affect. Neuro: CM 2-12 intact, extremity CSM intact bilaterally, normal speech  Results for orders placed or performed during the hospital encounter of 05/27/16 (from the past 48 hour(s))  Lipase, blood     Status: None   Collection Time: 05/27/16  7:48 AM  Result Value Ref Range   Lipase 21 11 - 51 U/L  Comprehensive metabolic panel     Status: Abnormal   Collection Time: 05/27/16  7:48 AM  Result Value Ref Range   Sodium 140 135 - 145 mmol/L   Potassium 3.6 3.5 - 5.1 mmol/L   Chloride 101 101 - 111 mmol/L   CO2 28 22 - 32 mmol/L   Glucose, Bld 115 (H) 65 - 99 mg/dL   BUN 7 6 - 20 mg/dL   Creatinine, Ser 0.99 0.44 - 1.00 mg/dL   Calcium 9.9 8.9 - 10.3 mg/dL  Total Protein 8.0 6.5 - 8.1 g/dL   Albumin 4.3 3.5 - 5.0 g/dL   AST 485 (H) 15 - 41 U/L   ALT 293 (H) 14 - 54 U/L   Alkaline Phosphatase 153 (H) 38 - 126 U/L   Total Bilirubin 2.2 (H) 0.3 - 1.2 mg/dL   GFR calc non Af Amer >60 >60 mL/min   GFR calc Af Amer >60 >60 mL/min    Comment: (NOTE) The eGFR has been calculated using the CKD EPI equation. This calculation has not been validated in all clinical  situations. eGFR's persistently <60 mL/min signify possible Chronic Kidney Disease.    Anion gap 11 5 - 15  CBC     Status: Abnormal   Collection Time: 05/27/16  7:48 AM  Result Value Ref Range   WBC 9.2 4.0 - 10.5 K/uL   RBC 5.18 (H) 3.87 - 5.11 MIL/uL   Hemoglobin 11.6 (L) 12.0 - 15.0 g/dL   HCT 37.6 36.0 - 46.0 %   MCV 72.6 (L) 78.0 - 100.0 fL   MCH 22.4 (L) 26.0 - 34.0 pg   MCHC 30.9 30.0 - 36.0 g/dL   RDW 15.5 11.5 - 15.5 %   Platelets 410 (H) 150 - 400 K/uL  Urinalysis, Routine w reflex microscopic     Status: Abnormal   Collection Time: 05/27/16  7:53 AM  Result Value Ref Range   Color, Urine AMBER (A) YELLOW    Comment: BIOCHEMICALS MAY BE AFFECTED BY COLOR   APPearance CLEAR CLEAR   Specific Gravity, Urine 1.023 1.005 - 1.030   pH 6.0 5.0 - 8.0   Glucose, UA NEGATIVE NEGATIVE mg/dL   Hgb urine dipstick NEGATIVE NEGATIVE   Bilirubin Urine MODERATE (A) NEGATIVE   Ketones, ur 5 (A) NEGATIVE mg/dL   Protein, ur 30 (A) NEGATIVE mg/dL   Nitrite NEGATIVE NEGATIVE   Leukocytes, UA NEGATIVE NEGATIVE   RBC / HPF 0-5 0 - 5 RBC/hpf   WBC, UA 0-5 0 - 5 WBC/hpf   Bacteria, UA NONE SEEN NONE SEEN   Squamous Epithelial / LPF 0-5 (A) NONE SEEN   Mucous PRESENT    US Abdomen Limited Ruq  Result Date: 05/27/2016 CLINICAL DATA:  Right upper quadrant abdominal pain, nausea, vomiting and diarrhea for the past 3 weeks. EXAM: US ABDOMEN LIMITED - RIGHT UPPER QUADRANT COMPARISON:  None. FINDINGS: Gallbladder: Multiple gallstones in the gallbladder measuring up to 8 mm in maximum diameter each. No gallbladder wall thickening or pericholecystic fluid. No sonographic Murphy's sign. Common bile duct: Diameter: 10.1 mm Liver: No focal lesion identified. Within normal limits in parenchymal echogenicity. IMPRESSION: 1. Cholelithiasis without evidence of cholecystitis. 2. Dilated common duct. This is suspicious for a nonvisualized distal common duct stone or stones. A nonvisualized mass or stricture  is less likely. Electronically Signed   By: Claudie Revering M.D.   On: 05/27/2016 12:56   Assessment/Plan Symptomatic cholelithiasis, suspect choledocholithiasis Transaminitis  Hyperbilirubinemia  - RUQ U/S multiple gallstones, no wall thickening or pericholecystic fluid. CBD 10.1 mm.  - AST 485, ALT 293, Alk Phos 153, T.bili 2.2  - WBC and lipase are WNL - recommend GI consultation for possible MRCP/ERCP for suspected CBD stone - will plan for laparoscopic cholecystectomy following appropriate GI work-up   Jill Alexanders, [facility] Surgery 05/27/2016, 3:48 PM Pager: [phone] Consults: [phone] Mon-Fri 7:00 am-4:30 pm Sat-Sun 7:00 am-11:30 am

## 2016-05-27 NOTE — H&P (Signed)
History and Physical    Evalette I Caskey QP:[id number] DOB: 08/26/1995 DOA: 05/27/2016  PCP: No PCP Per Patient   Patient coming from: home  Chief Complaint: epigastric pain  HPI: Destiny Stuart is a 21 y.o. female without medical history who gave a birth approximately three months ago has been having ongoing epigastric pain for almost two weeks. She presented to the ED with c/o sharp abdominal pain radiating to the back associated with nausea, vomiting and anorexia. Patient also had an episode of diarrhea a few days ago.  ED Course: In the ED her vital signs were stable except transient mild elevation of temperature to 79F on arrival blood work showed normal WBC count, significantly elevated LFTs with AST of  485 and ALT of 293. Alkaline phosphatase was elevated to 153  Patient underwent ultrasound that showed cholelithiasis with dilated common bile duct without evidence of cholecystitis GI consult was requested and Dr. Collene Mares plans to perform ERCP tomorrow  Surgical insult was requested as well  and tentative plan is to proceed with laparoscopic cholecystectomy cholecystectomy following appropriate GI workup   Review of Systems: As per HPI otherwise 10 point review of systems negative.   Ambulatory Status:Ambulates independently  History reviewed. No pertinent past medical history.  Past Surgical History:  Procedure Laterality Date  . FOOT SURGERY      Social History   Social History  . Marital status: Single    Spouse name: N/A  . Number of children: N/A  . Years of education: N/A   Occupational History  . Not on file.   Social History Main Topics  . Smoking status: Never Smoker  . Smokeless tobacco: Never Used  . Alcohol use No  . Drug use: No  . Sexual activity: Yes   Other Topics Concern  . Not on file   Social History Narrative  . No narrative on file    Allergies  Allergen Reactions  . Iodine Hives  . Latex Hives  . Peanut-Containing Drug Products  Hives    No family history on file.  Prior to Admission medications   Medication Sig Start Date End Date Taking? Authorizing Provider  calcium carbonate (TUMS - DOSED IN MG ELEMENTAL CALCIUM) 500 MG chewable tablet Chew 3-4 tablets by mouth as needed for indigestion or heartburn.    Historical Provider, MD  clobetasol (TEMOVATE) 0.05 % external solution Apply 1 application topically 2 (two) times daily as needed (for irritation).     Historical Provider, MD  NIFEdipine (PROCARDIA XL/ADALAT-CC) 60 MG 24 hr tablet Take 1 tablet (60 mg total) by mouth daily. 03/10/16   Starla Link, CNM  Prenatal Vit-Fe Fumarate-FA (PRENATAL MULTIVITAMIN) TABS tablet Take 1 tablet by mouth daily at 12 noon. 03/10/16   Starla Link, CNM  triamcinolone ointment (KENALOG) 0.1 % Apply 1 application topically 2 (two) times daily as needed (for irritation).    Historical Provider, MD    Physical Exam: Vitals:   05/27/16 0725 05/27/16 0747 05/27/16 1131 05/27/16 1622  BP: (!) 137/103  130/84 124/83  Pulse: 101  101 93  Resp: 18  16 18   Temp: 99 F (37.2 C)  98 F (36.7 C) 98.4 F (36.9 C)  TempSrc: Oral  Oral Oral  SpO2: 100%  100% 100%  Weight: 81.6 kg (180 lb) 83.6 kg (184 lb 6 oz)    Height: 5\' 8"  (1.727 m) 5\' 8"  (1.727 m)       General: Appears calm and comfortable Eyes: PERRLA,  EOMI, normal lids, iris ENT:  grossly normal hearing, lips & tongue, mucous membranes moist and intact Neck: no lymphoadenopathy, masses or thyromegaly Cardiovascular: RRR, no m/r/g. No JVD, carotid bruits. No LE edema.  Respiratory: bilateral no wheezes, rales, rhonchi or cracles. Normal respiratory effort. No accessory muscle use observed Abdomen: soft, non-tender, non-distended, no organomegaly or masses appreciated. BS present in all quadrants Skin: no rash, ulcers or induration seen on limited exam Musculoskeletal: grossly normal tone BUE/BLE, good ROM, no bony abnormality or joint deformities  observed Psychiatric: grossly normal mood and affect, speech fluent and appropriate, alert and oriented x3 Neurologic: CN II-XII grossly intact, moves all extremities in coordinated fashion, sensation intact  Labs on Admission: I have personally reviewed following labs and imaging studies  CBC, BMP  GFR: Estimated Creatinine Clearance: 101 mL/min (by C-G formula based on SCr of 0.99 mg/dL).   Creatinine Clearance: Estimated Creatinine Clearance: 101 mL/min (by C-G formula based on SCr of 0.99 mg/dL).    Radiological Exams on Admission: US Abdomen Limited Ruq  Result Date: 05/27/2016 CLINICAL DATA:  Right upper quadrant abdominal pain, nausea, vomiting and diarrhea for the past 3 weeks. EXAM: US ABDOMEN LIMITED - RIGHT UPPER QUADRANT COMPARISON:  None. FINDINGS: Gallbladder: Multiple gallstones in the gallbladder measuring up to 8 mm in maximum diameter each. No gallbladder wall thickening or pericholecystic fluid. No sonographic Murphy's sign. Common bile duct: Diameter: 10.1 mm Liver: No focal lesion identified. Within normal limits in parenchymal echogenicity. IMPRESSION: 1. Cholelithiasis without evidence of cholecystitis. 2. Dilated common duct. This is suspicious for a nonvisualized distal common duct stone or stones. A nonvisualized mass or stricture is less likely. Electronically Signed   By: Claudie Revering M.D.   On: 05/27/2016 12:56    EKG: Independently reviewed - pending  Assessment/Plan Principal Problem:   Cholelithiasis without cholecystitis Active Problems:   Pre-eclampsia   Elevated liver enzymes   Dilated bile duct   Thrombocythemia (HCC)    Cholelithiasis  Provide pain control with fentanyl and toradol, antiemetics Continue IV hydration Keep NPO after midnight for ERCP  Abnormal liver enzymes associated with cholelithiasis Continue IV hydration Continue to monitor LFT's  Pre-eclampsia treated with Procardia BP mainly remained controlled,except one episode  of elevated diastolic pressure Will add Hydralazine IV prn   Thrombocythemia Patient denied headache, visual changes, burning of the pals and feet, chest pain or any bleeding Continue to monitor, most likely this is postpartum    DVT prophylaxis: Lovenox once tonight and SCD d/t pplanned surgery Code Status: full Family Communication: none Disposition Plan: med surg Consults called: GI, general surgery Admission status:    Caprice Red Pager: [phone] [facility]  If 7PM-7AM, please contact night-coverage [email or web address] Password [facility]  05/27/2016, 5:16 PM

## 2016-05-27 NOTE — ED Notes (Signed)
Patient is stable and ready to be transport to the floor at this time.  Report was called to 6E RN.  Belongings taken with the patient to the floor.   

## 2016-05-27 NOTE — Progress Notes (Signed)
New Admission Note:    Arrival Method: Stretcher from ER Mental Orientation:Alert and Oriented Assessment:  Skin: IV: Pain: Safety Measures: In place. Side Rails x 2 and Call bell in reach Admission: 6E Orientation: Oriented to unit Family:  Pt has a 72 week old baby at home  Orders have been reviewed and implemented.  Will continue to monitor the patient.

## 2016-05-28 ENCOUNTER — Encounter (HOSPITAL_COMMUNITY): Payer: Self-pay

## 2016-05-28 ENCOUNTER — Observation Stay (HOSPITAL_COMMUNITY): Payer: Federal, State, Local not specified - PPO | Admitting: Anesthesiology

## 2016-05-28 ENCOUNTER — Observation Stay (HOSPITAL_COMMUNITY): Payer: Federal, State, Local not specified - PPO

## 2016-05-28 ENCOUNTER — Encounter (HOSPITAL_COMMUNITY): Admission: EM | Disposition: A | Discharge status: Home / Self Care | Payer: Self-pay | Source: Home / Self Care

## 2016-05-28 HISTORY — PX: ENDOSCOPIC RETROGRADE CHOLANGIOPANCREATOGRAPHY (ERCP) WITH PROPOFOL: SHX5810

## 2016-05-28 LAB — COMPREHENSIVE METABOLIC PANEL
ALK PHOS: 133 U/L — AB (ref 38–126)
ALT: 254 U/L — AB (ref 14–54)
AST: 312 U/L — ABNORMAL HIGH (ref 15–41)
Albumin: 3.6 g/dL (ref 3.5–5.0)
Anion gap: 9 (ref 5–15)
BILIRUBIN TOTAL: 2.3 mg/dL — AB (ref 0.3–1.2)
BUN: 5 mg/dL — ABNORMAL LOW (ref 6–20)
CALCIUM: 9.2 mg/dL (ref 8.9–10.3)
CO2: 25 mmol/L (ref 22–32)
CREATININE: 0.94 mg/dL (ref 0.44–1.00)
Chloride: 105 mmol/L (ref 101–111)
Glucose, Bld: 88 mg/dL (ref 65–99)
Potassium: 3.7 mmol/L (ref 3.5–5.1)
Sodium: 139 mmol/L (ref 135–145)
TOTAL PROTEIN: 6.8 g/dL (ref 6.5–8.1)

## 2016-05-28 LAB — CBC
HCT: 34.5 % — ABNORMAL LOW (ref 36.0–46.0)
Hemoglobin: 10.5 g/dL — ABNORMAL LOW (ref 12.0–15.0)
MCH: 22.1 pg — AB (ref 26.0–34.0)
MCHC: 30.4 g/dL (ref 30.0–36.0)
MCV: 72.6 fL — ABNORMAL LOW (ref 78.0–100.0)
PLATELETS: 333 10*3/uL (ref 150–400)
RBC: 4.75 MIL/uL (ref 3.87–5.11)
RDW: 15.7 % — ABNORMAL HIGH (ref 11.5–15.5)
WBC: 7.4 10*3/uL (ref 4.0–10.5)

## 2016-05-28 LAB — PROTIME-INR
INR: 1.09
PROTHROMBIN TIME: 14.1 s (ref 11.4–15.2)

## 2016-05-28 SURGERY — ENDOSCOPIC RETROGRADE CHOLANGIOPANCREATOGRAPHY (ERCP) WITH PROPOFOL
Anesthesia: General

## 2016-05-28 MED ORDER — METOCLOPRAMIDE HCL 5 MG/ML IJ SOLN: 10.0000 mg | Freq: Once | INTRAMUSCULAR | Status: DC | PRN

## 2016-05-28 MED ORDER — IOPAMIDOL (ISOVUE-300) INJECTION 61%
INTRAVENOUS | Status: AC
  Filled 2016-05-28: qty 50

## 2016-05-28 MED ORDER — GLUCAGON HCL RDNA (DIAGNOSTIC) 1 MG IJ SOLR
INTRAMUSCULAR | Status: AC
  Filled 2016-05-28: qty 1

## 2016-05-28 MED ORDER — CIPROFLOXACIN IN D5W 400 MG/200ML IV SOLN
INTRAVENOUS | Status: AC
  Filled 2016-05-28: qty 200

## 2016-05-28 MED ORDER — CIPROFLOXACIN IN D5W 400 MG/200ML IV SOLN
INTRAVENOUS | Status: DC | PRN
  Administered 2016-05-28: 400 mg via INTRAVENOUS

## 2016-05-28 MED ORDER — MEPERIDINE HCL 100 MG/ML IJ SOLN: 6.2500 mg | INTRAMUSCULAR | Status: DC | PRN

## 2016-05-28 MED ORDER — CEFAZOLIN SODIUM-DEXTROSE 2-4 GM/100ML-% IV SOLN
2.0000 g | INTRAVENOUS | Status: AC
  Filled 2016-05-28 (×2): qty 100

## 2016-05-28 MED ORDER — PROPOFOL 10 MG/ML IV BOLUS
INTRAVENOUS | Status: DC | PRN
Start: 2016-05-28 — End: 2016-05-28
  Administered 2016-05-28: 200 mg via INTRAVENOUS

## 2016-05-28 MED ORDER — LACTATED RINGERS IV SOLN: INTRAVENOUS | Status: DC

## 2016-05-28 MED ORDER — LABETALOL HCL 5 MG/ML IV SOLN
INTRAVENOUS | Status: DC | PRN
  Administered 2016-05-28: 5 mg via INTRAVENOUS

## 2016-05-28 MED ORDER — ARTIFICIAL TEARS OP OINT
TOPICAL_OINTMENT | OPHTHALMIC | Status: DC | PRN
  Administered 2016-05-28: 1 via OPHTHALMIC

## 2016-05-28 MED ORDER — FENTANYL CITRATE (PF) 100 MCG/2ML IJ SOLN: 25.0000 ug | INTRAMUSCULAR | Status: DC | PRN

## 2016-05-28 MED ORDER — SODIUM CHLORIDE 0.9 % IV SOLN
INTRAVENOUS | Status: DC | PRN
  Administered 2016-05-28: 20 mL

## 2016-05-28 MED ORDER — SODIUM CHLORIDE 0.9 % IV SOLN: INTRAVENOUS | Status: DC

## 2016-05-28 MED ORDER — SUCCINYLCHOLINE CHLORIDE 20 MG/ML IJ SOLN
INTRAMUSCULAR | Status: DC | PRN
  Administered 2016-05-28: 100 mg via INTRAVENOUS

## 2016-05-28 MED ORDER — INDOMETHACIN 50 MG RE SUPP
RECTAL | Status: DC | PRN
  Administered 2016-05-28 (×2): 50 mg via RECTAL

## 2016-05-28 MED ORDER — INDOMETHACIN 50 MG RE SUPP
RECTAL | Status: AC
  Filled 2016-05-28: qty 2

## 2016-05-28 MED ORDER — MIDAZOLAM HCL 5 MG/5ML IJ SOLN
INTRAMUSCULAR | Status: DC | PRN
  Administered 2016-05-28: 2 mg via INTRAVENOUS

## 2016-05-28 NOTE — Anesthesia Procedure Notes (Signed)
Procedure Name: Intubation Date/Time: 05/28/2016 12:18 PM Performed by: Izora Gala Pre-anesthesia Checklist: Patient identified, Emergency Drugs available, Suction available and Patient being monitored Patient Re-evaluated:Patient Re-evaluated prior to inductionOxygen Delivery Method: Circle system utilized Preoxygenation: Pre-oxygenation with 100% oxygen Intubation Type: IV induction Ventilation: Mask ventilation without difficulty Laryngoscope Size: 3 Grade View: Grade I Tube type: Oral Tube size: 7.5 mm Number of attempts: 1 Airway Equipment and Method: Stylet and LTA kit utilized Placement Confirmation: ETT inserted through vocal cords under direct vision,  positive ETCO2 and breath sounds checked- equal and bilateral Secured at: 22 cm Dental Injury: Teeth and Oropharynx as per pre-operative assessment

## 2016-05-28 NOTE — Interval H&P Note (Signed)
History and Physical Interval Note:  05/28/2016 12:10 PM  Destiny Stuart  has presented today for surgery, with the diagnosis of stones, dilated duct  The various methods of treatment have been discussed with the patient and family. After consideration of risks, benefits and other options for treatment, the patient has consented to  Procedure(s): ENDOSCOPIC RETROGRADE CHOLANGIOPANCREATOGRAPHY (ERCP) WITH PROPOFOL (N/A) as a surgical intervention .  The patient's history has been reviewed, patient examined, no change in status, stable for surgery.  I have reviewed the patient's chart and labs.  Questions were answered to the patient's satisfaction.     Kilee Hedding D

## 2016-05-28 NOTE — OR Nursing (Signed)
Back to floor. Vs now within parameters last 30 min

## 2016-05-28 NOTE — Anesthesia Postprocedure Evaluation (Signed)
Anesthesia Post Note  Patient: [place] I Start  Procedure(s) Performed: Procedure(s) (LRB): ENDOSCOPIC RETROGRADE CHOLANGIOPANCREATOGRAPHY (ERCP) WITH PROPOFOL (N/A)  Patient location during evaluation: Endoscopy Anesthesia Type: General Level of consciousness: awake and alert Pain management: pain level controlled Vital Signs Assessment: post-procedure vital signs reviewed and stable Respiratory status: spontaneous breathing, nonlabored ventilation, respiratory function stable and patient connected to nasal cannula oxygen Cardiovascular status: blood pressure returned to baseline and stable Postop Assessment: no signs of nausea or vomiting Anesthetic complications: no       Last Vitals:  Vitals:   05/28/16 1434 05/28/16 1652  BP: (!) 149/96 133/77  Pulse: 87 91  Resp: 14 14  Temp:  37.1 C    Last Pain:  Vitals:   05/28/16 1652  TempSrc: Oral  PainSc:                  Montez Hageman

## 2016-05-28 NOTE — H&P (View-Only) (Signed)
Unassigned patient Reason for Consult: Abnormal LFT's with gallstones. Referring Physician: THP  Destiny Stuart is an 21 y.o. female.  HPI:  Destiny Stuart is a 21 year old black female who presented to the [facility]: Emergency room with a two-week history of epigastric pain nausea and vomiting. She is 3 months postpartum. Over the last 2 weeks she been having worsening epigastric pain radiating to her back. She's had some anorexia as well she denies a history of hematemesis melena or melena. There is no history of fever chills or rigors. In the ER she was found to have a 10.1 mm common bile duct with cholelithiasis on an abdominal ultrasound. Her total bilirubin is 2.2 with elevated AST/ALT and Alkaline phosphatase. There is no evidence of cholecystitis. She is otherwise healthy she had some problems with preeclampsia during her pregnancy.     History reviewed. No pertinent past medical history.  Past Surgical History:  Procedure Laterality Date  . FOOT SURGERY     No family history on file.  Social History:  reports that she has never smoked. She has never used smokeless tobacco. She reports that she does not drink alcohol or use drugs. She is single and works for [employer] she just started her job recently   Allergies:  Allergies  Allergen Reactions  . Iodine Hives  . Latex Hives  . Peanut-Containing Drug Products Hives   Medications: I have reviewed the patient's current medications.  Results for orders placed or performed during the hospital encounter of 05/27/16 (from the past 48 hour(s))  Lipase, blood     Status: None   Collection Time: 05/27/16  7:48 AM  Result Value Ref Range   Lipase 21 11 - 51 U/L  Comprehensive metabolic panel     Status: Abnormal   Collection Time: 05/27/16  7:48 AM  Result Value Ref Range   Sodium 140 135 - 145 mmol/L   Potassium 3.6 3.5 - 5.1 mmol/L   Chloride 101 101 - 111 mmol/L   CO2 28 22 - 32 mmol/L   Glucose, Bld 115 (H) 65 - 99  mg/dL   BUN 7 6 - 20 mg/dL   Creatinine, Ser 0.99 0.44 - 1.00 mg/dL   Calcium 9.9 8.9 - 10.3 mg/dL   Total Protein 8.0 6.5 - 8.1 g/dL   Albumin 4.3 3.5 - 5.0 g/dL   AST 485 (H) 15 - 41 U/L   ALT 293 (H) 14 - 54 U/L   Alkaline Phosphatase 153 (H) 38 - 126 U/L   Total Bilirubin 2.2 (H) 0.3 - 1.2 mg/dL   GFR calc non Af Amer >60 >60 mL/min   GFR calc Af Amer >60 >60 mL/min    Comment: (NOTE) The eGFR has been calculated using the CKD EPI equation. This calculation has not been validated in all clinical situations. eGFR's persistently <60 mL/min signify possible Chronic Kidney Disease.    Anion gap 11 5 - 15  CBC     Status: Abnormal   Collection Time: 05/27/16  7:48 AM  Result Value Ref Range   WBC 9.2 4.0 - 10.5 K/uL   RBC 5.18 (H) 3.87 - 5.11 MIL/uL   Hemoglobin 11.6 (L) 12.0 - 15.0 g/dL   HCT 37.6 36.0 - 46.0 %   MCV 72.6 (L) 78.0 - 100.0 fL   MCH 22.4 (L) 26.0 - 34.0 pg   MCHC 30.9 30.0 - 36.0 g/dL   RDW 15.5 11.5 - 15.5 %   Platelets 410 (H) 150 -  400 K/uL  Urinalysis, Routine w reflex microscopic     Status: Abnormal   Collection Time: 05/27/16  7:53 AM  Result Value Ref Range   Color, Urine AMBER (A) YELLOW    Comment: BIOCHEMICALS MAY BE AFFECTED BY COLOR   APPearance CLEAR CLEAR   Specific Gravity, Urine 1.023 1.005 - 1.030   pH 6.0 5.0 - 8.0   Glucose, UA NEGATIVE NEGATIVE mg/dL   Hgb urine dipstick NEGATIVE NEGATIVE   Bilirubin Urine MODERATE (A) NEGATIVE   Ketones, ur 5 (A) NEGATIVE mg/dL   Protein, ur 30 (A) NEGATIVE mg/dL   Nitrite NEGATIVE NEGATIVE   Leukocytes, UA NEGATIVE NEGATIVE   RBC / HPF 0-5 0 - 5 RBC/hpf   WBC, UA 0-5 0 - 5 WBC/hpf   Bacteria, UA NONE SEEN NONE SEEN   Squamous Epithelial / LPF 0-5 (A) NONE SEEN   Mucous PRESENT    US Abdomen Limited Ruq  Result Date: 05/27/2016 CLINICAL DATA:  Right upper quadrant abdominal pain, nausea, vomiting and diarrhea for the past 3 weeks. EXAM: US ABDOMEN LIMITED - RIGHT UPPER QUADRANT COMPARISON:   None. FINDINGS: Gallbladder: Multiple gallstones in the gallbladder measuring up to 8 mm in maximum diameter each. No gallbladder wall thickening or pericholecystic fluid. No sonographic Murphy's sign. Common bile duct: Diameter: 10.1 mm Liver: No focal lesion identified. Within normal limits in parenchymal echogenicity. IMPRESSION: 1. Cholelithiasis without evidence of cholecystitis. 2. Dilated common duct. This is suspicious for a nonvisualized distal common duct stone or stones. A nonvisualized mass or stricture is less likely. Electronically Signed   By: Claudie Revering M.D.   On: 05/27/2016 12:56    Review of Systems  Constitutional: Negative.   HENT: Negative.   Eyes: Negative.   Respiratory: Negative.   Cardiovascular: Negative.   Gastrointestinal: Positive for abdominal pain, nausea and vomiting. Negative for blood in stool, constipation, diarrhea, heartburn and melena.  Genitourinary: Negative.   Musculoskeletal: Negative.   Skin: Negative.   Neurological: Negative.   Endo/Heme/Allergies: Negative.   Psychiatric/Behavioral: Negative.    Blood pressure 130/84, pulse 101, temperature 98 F (36.7 C), temperature source Oral, resp. rate 16, height 5' 8"  (1.727 m), weight 83.6 kg (184 lb 6 oz), last menstrual period 05/27/2016, SpO2 100 %, not currently breastfeeding. Physical Exam  Constitutional: She is oriented to person, place, and time. She appears well-developed and well-nourished.  HENT:  Head: Normocephalic and atraumatic.  Eyes: Conjunctivae and EOM are normal. Pupils are equal, round, and reactive to light.  Neck: Normal range of motion. Neck supple.  Cardiovascular: Normal rate and regular rhythm.   Respiratory: Effort normal and breath sounds normal.  GI: Soft. Bowel sounds are normal. She exhibits no mass. There is tenderness. There is no rebound and no guarding.  Musculoskeletal: Normal range of motion.  Neurological: She is alert and oriented to person, place, and time.   Skin: Skin is warm and dry.  Psychiatric: She has a normal mood and affect. Her behavior is normal. Judgment and thought content normal.   Assessment/Plan: 1) Cholelithiasis with a dilated common bile duct and abnormal LFTs- AN ERCP is planned for tomorrow appreciate surgical consultation for the recommendation made thereafter.   Priscille Shadduck 05/27/2016, 3:40 PM

## 2016-05-28 NOTE — OR Nursing (Signed)
Notified dr Linna Caprice of diastolic bp slightly above 100. otw patient ready to return to unit

## 2016-05-28 NOTE — Progress Notes (Signed)
  Subjective: PT doing well No abd pain currently  Objective: Vital signs in last 24 hours: Temp:  [98 F (36.7 C)-98.4 F (36.9 C)] 98.1 F (36.7 C) (01/23 0644) Pulse Rate:  [57-104] 92 (01/23 0644) Resp:  [16-18] 16 (01/23 0644) BP: (124-131)/(78-84) 125/78 (01/23 0644) SpO2:  [100 %] 100 % (01/23 0644)    Intake/Output from previous day: 01/22 0701 - 01/23 0700 In: 1000 [I.V.:1000] Out: 0  Intake/Output this shift: No intake/output data recorded.  General appearance: alert and cooperative GI: soft, non-tender; bowel sounds normal; no masses,  no organomegaly  Lab Results:   Recent Labs  05/27/16 0748 05/28/16 0522  WBC 9.2 7.4  HGB 11.6* 10.5*  HCT 37.6 34.5*  PLT 410* 333   BMET  Recent Labs  05/27/16 0748 05/28/16 0522  NA 140 139  K 3.6 3.7  CL 101 105  CO2 28 25  GLUCOSE 115* 88  BUN 7 5*  CREATININE 0.99 0.94  CALCIUM 9.9 9.2   PT/INR  Recent Labs  05/28/16 0522  LABPROT 14.1  INR 1.09   ABG No results for input(s): PHART, HCO3 in the last 72 hours.  Invalid input(s): PCO2, PO2  Studies/Results: US Abdomen Limited Ruq  Result Date: 05/27/2016 CLINICAL DATA:  Right upper quadrant abdominal pain, nausea, vomiting and diarrhea for the past 3 weeks. EXAM: US ABDOMEN LIMITED - RIGHT UPPER QUADRANT COMPARISON:  None. FINDINGS: Gallbladder: Multiple gallstones in the gallbladder measuring up to 8 mm in maximum diameter each. No gallbladder wall thickening or pericholecystic fluid. No sonographic Murphy's sign. Common bile duct: Diameter: 10.1 mm Liver: No focal lesion identified. Within normal limits in parenchymal echogenicity. IMPRESSION: 1. Cholelithiasis without evidence of cholecystitis. 2. Dilated common duct. This is suspicious for a nonvisualized distal common duct stone or stones. A nonvisualized mass or stricture is less likely. Electronically Signed   By: Claudie Revering M.D.   On: 05/27/2016 12:56    Anti-infectives: Anti-infectives     None      Assessment/Plan: Cholelithiasis Choledocholithiasis  1. Plan for OR in AM, lap chole 2. All risks and benefits were discussed with the patient to generally include: infection, bleeding, possible need for post op ERCP, damage to the bile ducts, and bile leak. Alternatives were offered and described.  All questions were answered and the patient voiced understanding of the procedure and wishes to proceed at this point with a laparoscopic cholecystectomy   LOS: 1 day    Rosario Jacks., Anne Hahn 05/28/2016

## 2016-05-28 NOTE — Progress Notes (Signed)
Pt has left via wheelchair for ECRP procedure.

## 2016-05-28 NOTE — Op Note (Signed)
[facility] Patient Name: Destiny Stuart Procedure Date : 05/28/2016 MRN: [id number] Attending MD: Carol Ada , MD Date of Birth: 10-Aug-1995 CSN: [id number] Age: 21 Admit Type: Inpatient Procedure:                ERCP Indications:              For therapy of bile duct stone(s) Providers:                Carol Ada, MD, Burtis Junes, RN, Corliss Parish,                            Technician Referring MD:              Medicines:                General Anesthesia, 100 mg indomethacin PR. Complications:            No immediate complications. Estimated Blood Loss:     Estimated blood loss: none. Procedure:                Pre-Anesthesia Assessment:                           - Prior to the procedure, a History and Physical                            was performed, and patient medications and                            allergies were reviewed. The patient's tolerance of                            previous anesthesia was also reviewed. The risks                            and benefits of the procedure and the sedation                            options and risks were discussed with the patient.                            All questions were answered, and informed consent                            was obtained. Prior Anticoagulants: The patient has                            taken no previous anticoagulant or antiplatelet                            agents. ASA Grade Assessment: I - A normal, healthy                            patient. After reviewing the risks and benefits,  the patient was deemed in satisfactory condition to                            undergo the procedure.                           - Sedation was administered by an anesthesia                            professional. General anesthesia was attained.                           After obtaining informed consent, the scope was                            passed under direct vision.  Throughout the                            procedure, the patient's blood pressure, pulse, and                            oxygen saturations were monitored continuously. The                            [id number]) scope was introduced through                            the mouth, and used to inject contrast into and                            used to inject contrast into the bile duct. The                            ERCP was accomplished without difficulty. The                            patient tolerated the procedure well. Scope In: Scope Out: Findings:      To discover objects, the biliary tree was swept with a 15 mm balloon       starting at the bifurcation. One stone was removed. No stones remained.      The CBD was easily cannulated and the guidewire was secured in the right       intrahepatic ducts. contrast injection revealed a dilated CBD at 10-12       mm and a distal filling defect consistent with a round stone. A 10-12 mm       sphincterotomy was created and some sludge escaped from the bile duct.       While sizing up the sphincterotomy with the sphincterotome in full bow       position the distal stone was extracted. The CBD was swept 5 more times       and there was no evidence of any retained stones. Impression:               - Choledocholithiasis was found. Complete removal  was accomplished by balloon extraction.                           - The biliary tree was swept. Recommendation:           - Return patient to hospital ward for ongoing care.                           - Clear liquid diet and then NPO after midnight.                           - Continue present medications.                           - Cholecystectomy per Surgery. Procedure Code(s):        --- Professional ---                           [phone], Endoscopic retrograde                            cholangiopancreatography (ERCP); with removal of                             calculi/debris from biliary/pancreatic duct(s) Diagnosis Code(s):        --- Professional ---                           K80.50, Calculus of bile duct without cholangitis                            or cholecystitis without obstruction CPT copyright 2016 American Medical Association. All rights reserved. The codes documented in this report are preliminary and upon coder review may  be revised to meet current compliance requirements. Carol Ada, MD Carol Ada, MD 05/28/2016 1:17:04 PM This report has been signed electronically. Number of Addenda: 0

## 2016-05-28 NOTE — Anesthesia Preprocedure Evaluation (Signed)
Anesthesia Evaluation  Patient identified by MRN, date of birth, ID band Patient awake    Reviewed: Allergy & Precautions, NPO status , Patient's Chart, lab work & pertinent test results  Airway Mallampati: II  TM Distance: >3 FB Neck ROM: Full    Dental no notable dental hx.    Pulmonary neg pulmonary ROS,    Pulmonary exam normal breath sounds clear to auscultation       Cardiovascular negative cardio ROS Normal cardiovascular exam Rhythm:Regular Rate:Normal     Neuro/Psych negative neurological ROS  negative psych ROS   GI/Hepatic negative GI ROS, Neg liver ROS,   Endo/Other  negative endocrine ROS  Renal/GU negative Renal ROS  negative genitourinary   Musculoskeletal negative musculoskeletal ROS (+)   Abdominal   Peds negative pediatric ROS (+)  Hematology negative hematology ROS (+)   Anesthesia Other Findings   Reproductive/Obstetrics negative OB ROS                             Anesthesia Physical Anesthesia Plan  ASA: II  Anesthesia Plan: General   Post-op Pain Management:    Induction: Intravenous  Airway Management Planned: Oral ETT  Additional Equipment:   Intra-op Plan:   Post-operative Plan: Extubation in OR  Informed Consent: I have reviewed the patients History and Physical, chart, labs and discussed the procedure including the risks, benefits and alternatives for the proposed anesthesia with the patient or authorized representative who has indicated his/her understanding and acceptance.   Dental advisory given  Plan Discussed with: CRNA  Anesthesia Plan Comments:         Anesthesia Quick Evaluation  

## 2016-05-28 NOTE — Transfer of Care (Signed)
Immediate Anesthesia Transfer of Care Note  Patient: Destiny Stuart  Procedure(s) Performed: Procedure(s): ENDOSCOPIC RETROGRADE CHOLANGIOPANCREATOGRAPHY (ERCP) WITH PROPOFOL (N/A)  Patient Location: Endoscopy Unit  Anesthesia Type:General  Level of Consciousness: awake, oriented and patient cooperative  Airway & Oxygen Therapy: Patient Spontanous Breathing  Post-op Assessment: Report given to RN, Post -op Vital signs reviewed and stable, Patient moving all extremities and Patient moving all extremities X 4  Post vital signs: Reviewed and stable  Last Vitals:  Vitals:   05/28/16 1122 05/28/16 1312  BP: (!) 152/85   Pulse: (!) 107 (!) 113  Resp: (!) 23 (!) 26  Temp: 36.9 C     Last Pain:  Vitals:   05/28/16 1122  TempSrc: Oral  PainSc:          Complications: No apparent anesthesia complications

## 2016-05-28 NOTE — Progress Notes (Signed)
Seen and examined and discussed with patient No pain No nausea no vomiting No blurred or double vision Dysuria Nothing by mouth for ERCP Abdomen is soft nontender nondistended no rebound no Murphy sign Plan is for ERCP and then eventual laparoscopic cholecystectomy I discussed with Dr. Rosendo Gros general surgery who agrees to take the patient to on as primary service [facility] Please consult hospitalist if further needs and thank you 15 minute discussion with mother   Destiny Griffes, MD [facility] (P) [phone]

## 2016-05-29 ENCOUNTER — Observation Stay (HOSPITAL_COMMUNITY): Payer: Federal, State, Local not specified - PPO | Admitting: Certified Registered Nurse Anesthetist

## 2016-05-29 ENCOUNTER — Encounter (HOSPITAL_COMMUNITY): Admission: EM | Disposition: A | Discharge status: Home / Self Care | Payer: Self-pay | Source: Home / Self Care

## 2016-05-29 HISTORY — PX: CHOLECYSTECTOMY: SHX55

## 2016-05-29 LAB — I-STAT BETA HCG BLOOD, ED (NOT ORDERABLE): I-stat hCG, quantitative: <5 m[IU]/mL (ref <5)

## 2016-05-29 SURGERY — LAPAROSCOPIC CHOLECYSTECTOMY WITH INTRAOPERATIVE CHOLANGIOGRAM
Anesthesia: General | Site: Abdomen

## 2016-05-29 MED ORDER — ROCURONIUM BROMIDE 100 MG/10ML IV SOLN
INTRAVENOUS | Status: DC | PRN
  Administered 2016-05-29: 40 mg via INTRAVENOUS

## 2016-05-29 MED ORDER — FENTANYL CITRATE (PF) 100 MCG/2ML IJ SOLN
25.0000 ug | INTRAMUSCULAR | Status: DC | PRN
  Administered 2016-05-29: 50 ug via INTRAVENOUS

## 2016-05-29 MED ORDER — HYDROMORPHONE HCL 1 MG/ML IJ SOLN
INTRAMUSCULAR | Status: AC
  Filled 2016-05-29: qty 1

## 2016-05-29 MED ORDER — FENTANYL CITRATE (PF) 100 MCG/2ML IJ SOLN
INTRAMUSCULAR | Status: AC
  Filled 2016-05-29: qty 4

## 2016-05-29 MED ORDER — 0.9 % SODIUM CHLORIDE (POUR BTL) OPTIME
TOPICAL | Status: DC | PRN
  Administered 2016-05-29: 1000 mL

## 2016-05-29 MED ORDER — BUPIVACAINE HCL (PF) 0.25 % IJ SOLN
INTRAMUSCULAR | Status: AC
  Filled 2016-05-29: qty 30

## 2016-05-29 MED ORDER — SODIUM CHLORIDE 0.9 % IR SOLN
Status: DC | PRN
  Administered 2016-05-29: 1000 mL

## 2016-05-29 MED ORDER — FENTANYL CITRATE (PF) 100 MCG/2ML IJ SOLN
INTRAMUSCULAR | Status: DC | PRN
  Administered 2016-05-29: 150 ug via INTRAVENOUS
  Administered 2016-05-29 (×2): 50 ug via INTRAVENOUS

## 2016-05-29 MED ORDER — ONDANSETRON HCL 4 MG/2ML IJ SOLN
4.0000 mg | Freq: Four times a day (QID) | INTRAMUSCULAR | Status: DC | PRN
Start: 2016-05-29 — End: 2016-05-30
  Administered 2016-05-29: 4 mg via INTRAVENOUS

## 2016-05-29 MED ORDER — LIDOCAINE HCL (CARDIAC) 20 MG/ML IV SOLN
INTRAVENOUS | Status: DC | PRN
  Administered 2016-05-29: 60 mg via INTRATRACHEAL
  Administered 2016-05-29: 100 mg via INTRAVENOUS

## 2016-05-29 MED ORDER — NEOSTIGMINE METHYLSULFATE 10 MG/10ML IV SOLN
INTRAVENOUS | Status: DC | PRN
  Administered 2016-05-29: 4 mg via INTRAVENOUS

## 2016-05-29 MED ORDER — IOPAMIDOL (ISOVUE-300) INJECTION 61%
INTRAVENOUS | Status: AC
  Filled 2016-05-29: qty 50

## 2016-05-29 MED ORDER — MIDAZOLAM HCL 5 MG/5ML IJ SOLN
INTRAMUSCULAR | Status: DC | PRN
  Administered 2016-05-29: 2 mg via INTRAVENOUS

## 2016-05-29 MED ORDER — FENTANYL CITRATE (PF) 100 MCG/2ML IJ SOLN
INTRAMUSCULAR | Status: AC
  Filled 2016-05-29: qty 2

## 2016-05-29 MED ORDER — KETOROLAC TROMETHAMINE 30 MG/ML IJ SOLN
INTRAMUSCULAR | Status: DC | PRN
  Administered 2016-05-29: 30 mg via INTRAVENOUS

## 2016-05-29 MED ORDER — OXYCODONE-ACETAMINOPHEN 5-325 MG PO TABS: 1.0000 | ORAL_TABLET | ORAL | 0 refills | Status: DC | PRN

## 2016-05-29 MED ORDER — BUPIVACAINE HCL 0.25 % IJ SOLN
INTRAMUSCULAR | Status: DC | PRN
  Administered 2016-05-29: 6 mL

## 2016-05-29 MED ORDER — ONDANSETRON HCL 4 MG/2ML IJ SOLN
INTRAMUSCULAR | Status: DC | PRN
  Administered 2016-05-29: 4 mg via INTRAVENOUS

## 2016-05-29 MED ORDER — HYDROMORPHONE HCL 1 MG/ML IJ SOLN
INTRAMUSCULAR | Status: DC | PRN
  Administered 2016-05-29 (×2): 0.5 mg via INTRAVENOUS

## 2016-05-29 MED ORDER — MIDAZOLAM HCL 2 MG/2ML IJ SOLN
INTRAMUSCULAR | Status: AC
  Filled 2016-05-29: qty 2

## 2016-05-29 MED ORDER — PROPOFOL 10 MG/ML IV BOLUS
INTRAVENOUS | Status: AC
  Filled 2016-05-29: qty 20

## 2016-05-29 MED ORDER — DEXAMETHASONE SODIUM PHOSPHATE 10 MG/ML IJ SOLN
INTRAMUSCULAR | Status: DC | PRN
  Administered 2016-05-29: 10 mg via INTRAVENOUS

## 2016-05-29 MED ORDER — HYDROMORPHONE HCL 1 MG/ML IJ SOLN
1.0000 mg | INTRAMUSCULAR | Status: DC | PRN
  Administered 2016-05-29 – 2016-05-30 (×2): 1 mg via INTRAVENOUS
  Filled 2016-05-29 (×2): qty 1

## 2016-05-29 MED ORDER — PROPOFOL 10 MG/ML IV BOLUS
INTRAVENOUS | Status: DC | PRN
  Administered 2016-05-29: 150 mg via INTRAVENOUS

## 2016-05-29 MED ORDER — DIPHENHYDRAMINE HCL 50 MG/ML IJ SOLN
INTRAMUSCULAR | Status: DC | PRN
  Administered 2016-05-29: 25 mg via INTRAVENOUS

## 2016-05-29 MED ORDER — GLYCOPYRROLATE 0.2 MG/ML IJ SOLN
INTRAMUSCULAR | Status: DC | PRN
Start: 2016-05-29 — End: 2016-05-29
  Administered 2016-05-29: 0.6 mg via INTRAVENOUS

## 2016-05-29 MED ORDER — LACTATED RINGERS IV SOLN
INTRAVENOUS | Status: DC
  Administered 2016-05-29: 10:00:00 via INTRAVENOUS

## 2016-05-29 MED ORDER — ONDANSETRON 4 MG PO TBDP: 4.0000 mg | ORAL_TABLET | Freq: Four times a day (QID) | ORAL | Status: DC | PRN

## 2016-05-29 SURGICAL SUPPLY — 44 items
APPLIER CLIP 5 13 M/L LIGAMAX5 (MISCELLANEOUS)
BENZOIN TINCTURE PRP APPL 2/3 (GAUZE/BANDAGES/DRESSINGS) ×2 IMPLANT
CANISTER SUCTION 2500CC (MISCELLANEOUS) ×2 IMPLANT
CHLORAPREP W/TINT 26ML (MISCELLANEOUS) ×2 IMPLANT
CLIP APPLIE 5 13 M/L LIGAMAX5 (MISCELLANEOUS) IMPLANT
CLIP LIGATING HEMO O LOK GREEN (MISCELLANEOUS) ×2 IMPLANT
COVER MAYO STAND STRL (DRAPES) ×2 IMPLANT
COVER SURGICAL LIGHT HANDLE (MISCELLANEOUS) ×2 IMPLANT
COVER TRANSDUCER ULTRASND (DRAPES) IMPLANT
DEVICE TROCAR PUNCTURE CLOSURE (ENDOMECHANICALS) ×2 IMPLANT
DRAPE C-ARM 42X72 X-RAY (DRAPES) ×2 IMPLANT
ELECT REM PT RETURN 9FT ADLT (ELECTROSURGICAL) ×2
ELECTRODE REM PT RTRN 9FT ADLT (ELECTROSURGICAL) ×1 IMPLANT
ENDOLOOP SUT PDS II  0 18 (SUTURE) ×1
ENDOLOOP SUT PDS II 0 18 (SUTURE) ×1 IMPLANT
GAUZE SPONGE 2X2 8PLY STRL LF (GAUZE/BANDAGES/DRESSINGS) ×1 IMPLANT
GLOVE BIO SURGEON STRL SZ7.5 (GLOVE) ×2 IMPLANT
GOWN STRL REUS W/ TWL LRG LVL3 (GOWN DISPOSABLE) ×2 IMPLANT
GOWN STRL REUS W/ TWL XL LVL3 (GOWN DISPOSABLE) ×1 IMPLANT
GOWN STRL REUS W/TWL LRG LVL3 (GOWN DISPOSABLE) ×2
GOWN STRL REUS W/TWL XL LVL3 (GOWN DISPOSABLE) ×1
IV CATH 14GX2 1/4 (CATHETERS) ×2 IMPLANT
KIT BASIN OR (CUSTOM PROCEDURE TRAY) ×2 IMPLANT
KIT ROOM TURNOVER OR (KITS) ×2 IMPLANT
NEEDLE INSUFFLATION 14GA 120MM (NEEDLE) ×2 IMPLANT
NS IRRIG 1000ML POUR BTL (IV SOLUTION) ×2 IMPLANT
PAD ARMBOARD 7.5X6 YLW CONV (MISCELLANEOUS) ×4 IMPLANT
POUCH RETRIEVAL ECOSAC 10 (ENDOMECHANICALS) ×1 IMPLANT
POUCH RETRIEVAL ECOSAC 10MM (ENDOMECHANICALS) ×1
POUCH SPECIMEN RETRIEVAL 10MM (ENDOMECHANICALS) IMPLANT
SCISSORS LAP 5X35 DISP (ENDOMECHANICALS) ×2 IMPLANT
SET CHOLANGIOGRAPHY FRANKLIN (SET/KITS/TRAYS/PACK) ×2 IMPLANT
SET IRRIG TUBING LAPAROSCOPIC (IRRIGATION / IRRIGATOR) ×2 IMPLANT
SLEEVE ENDOPATH XCEL 5M (ENDOMECHANICALS) ×2 IMPLANT
SPECIMEN JAR SMALL (MISCELLANEOUS) ×2 IMPLANT
SPONGE GAUZE 2X2 STER 10/PKG (GAUZE/BANDAGES/DRESSINGS) ×1
STRIP CLOSURE SKIN 1/2X4 (GAUZE/BANDAGES/DRESSINGS) ×2 IMPLANT
SUT MNCRL AB 3-0 PS2 18 (SUTURE) ×2 IMPLANT
TOWEL OR 17X24 6PK STRL BLUE (TOWEL DISPOSABLE) ×2 IMPLANT
TOWEL OR 17X26 10 PK STRL BLUE (TOWEL DISPOSABLE) ×2 IMPLANT
TRAY LAPAROSCOPIC MC (CUSTOM PROCEDURE TRAY) ×2 IMPLANT
TROCAR XCEL NON-BLD 11X100MML (ENDOMECHANICALS) ×2 IMPLANT
TROCAR XCEL NON-BLD 5MMX100MML (ENDOMECHANICALS) ×2 IMPLANT
TUBING INSUFFLATION (TUBING) ×2 IMPLANT

## 2016-05-29 NOTE — Transfer of Care (Signed)
Immediate Anesthesia Transfer of Care Note  Patient: Destiny Stuart  Procedure(s) Performed: Procedure(s): LAPAROSCOPIC CHOLECYSTECTOMY (N/A)  Patient Location: PACU  Anesthesia Type:General  Level of Consciousness: awake, alert  and oriented  Airway & Oxygen Therapy: Patient Spontanous Breathing  Post-op Assessment: Report given to RN and Post -op Vital signs reviewed and stable  Post vital signs: Reviewed and stable  Last Vitals:  Vitals:   05/29/16 0856 05/29/16 1137  BP: 138/69   Pulse: 93   Resp: 17   Temp: 36.9 C (P) 36.3 C    Last Pain:  Vitals:   05/29/16 0856  TempSrc: Oral  PainSc:          Complications: No apparent anesthesia complications

## 2016-05-29 NOTE — Op Note (Signed)
05/29/2016  11:12 AM  PATIENT:  Destiny Stuart  21 y.o. female  PRE-OPERATIVE DIAGNOSIS:  Gallstones  POST-OPERATIVE DIAGNOSIS:  Gallstones  PROCEDURE:  Procedure(s): LAPAROSCOPIC CHOLECYSTECTOMY (N/A)  SURGEON:  Surgeon(s) and Role:    * Ralene Ok, MD - Primary  ANESTHESIA:   local and general  EBL:  5cc  BLOOD ADMINISTERED:none  DRAINS: none   LOCAL MEDICATIONS USED:  BUPIVICAINE   SPECIMEN:  Source of Specimen:  gallbladder  DISPOSITION OF SPECIMEN:  PATHOLOGY  COUNTS:  YES  TOURNIQUET:  * No tourniquets in log *  DICTATION: .Dragon Dictation The patient was taken to the operating and placed in the supine position with bilateral SCDs in place. The patient was prepped and draped in the usual sterile fashion. A time out was called and all facts were verified. A pneumoperitoneum was obtained via A Veress needle technique to a pressure of 70mm of mercury.  A 45mm trochar was then placed in the right upper quadrant under visualization, and there were no injuries to any abdominal organs. A 11 mm port was then placed in the umbilical region after infiltrating with local anesthesia under direct visualization. A second and third epigastric port and right lower quadrant port placement under direct visualization, respectively. The gallbladder was identified and retracted, the peritoneum was then sharply dissected from the gallbladder and this dissection was carried down to Calot's triangle. The gallbladder was identified and stripped away circumferentially and seen going into the gallbladder 360, the critical angle was obtained. The cystic duct was seen to be dilated but appeared to be free of any stones on laparoscopic palpation with instrumentation. The cystic artery was identified and 2 clips placed proximally and one distally and transected. 1 clip was placed proximally on the duct.  Secondary to size it was transected and a 0 PDS Endoloop was used to ligate the cystic duct.  We then proceeded to remove the gallbladder off the hepatic fossa with Bovie cautery. A retrieval bag was then placed in the abdomen and gallbladder placed in the bag. The hepatic fossa was then reexamined and hemostasis was achieved with Bovie cautery and was excellent at the end of the case. The subhepatic fossa and perihepatic fossa was then irrigated until the effluent was clear.  The gallbladder and bag were removed from the abdominal cavity. The 11 mm trocar fascia was reapproximated with the Endo Close #1 Vicryl x2. The pneumoperitoneum was evacuated and all trochars removed under direct visulalization. The skin was then closed with 4-0 Monocryl and the skin dressed with Steri-Strips, gauze, and tape. The patient was awaken from general anesthesia and taken to the recovery room in stable condition.    PLAN OF CARE: Admit for overnight observation  PATIENT DISPOSITION:  PACU - hemodynamically stable.   Delay start of Pharmacological VTE agent (>24hrs) due to surgical blood loss or risk of bleeding: not applicable

## 2016-05-29 NOTE — Plan of Care (Signed)
Problem: Education: Goal: Knowledge of [facility] Education information/materials will improve Outcome: Progressing POC reviewed with pt.   

## 2016-05-29 NOTE — Anesthesia Postprocedure Evaluation (Signed)
Anesthesia Post Note  Patient: Destiny Stuart  Procedure(s) Performed: Procedure(s) (LRB): LAPAROSCOPIC CHOLECYSTECTOMY (N/A)  Patient location during evaluation: PACU Anesthesia Type: General Level of consciousness: sedated Pain management: satisfactory to patient Vital Signs Assessment: post-procedure vital signs reviewed and stable Respiratory status: spontaneous breathing Cardiovascular status: stable Anesthetic complications: no       Last Vitals:  Vitals:   05/29/16 1230 05/29/16 1240  BP: 132/65   Pulse: 90 92  Resp: 16   Temp:  36.5 C    Last Pain:  Vitals:   05/29/16 1240  TempSrc:   PainSc: Asleep                 Jerelyn Trimarco EDWARD

## 2016-05-29 NOTE — Progress Notes (Signed)
Received Pt from the OR. Pt was brought back to room in bed. Pt is sleeping and has peripheral IV with LR infusing. Will continue to monitor

## 2016-05-29 NOTE — Anesthesia Preprocedure Evaluation (Addendum)
Anesthesia Evaluation  Patient identified by MRN, date of birth, ID band Patient awake    Reviewed: Allergy & Precautions, H&P , Patient's Chart, lab work & pertinent test results, reviewed documented beta blocker date and time   Airway Mallampati: II  TM Distance: >3 FB Neck ROM: full    Dental no notable dental hx.    Pulmonary    Pulmonary exam normal breath sounds clear to auscultation       Cardiovascular  Rhythm:regular Rate:Normal     Neuro/Psych    GI/Hepatic   Endo/Other    Renal/GU      Musculoskeletal   Abdominal   Peds  Hematology   Anesthesia Other Findings   Reproductive/Obstetrics                             Anesthesia Physical Anesthesia Plan  ASA: II  Anesthesia Plan: General   Post-op Pain Management:    Induction: Intravenous  Airway Management Planned: Oral ETT  Additional Equipment:   Intra-op Plan:   Post-operative Plan: Extubation in OR  Informed Consent: I have reviewed the patients History and Physical, chart, labs and discussed the procedure including the risks, benefits and alternatives for the proposed anesthesia with the patient or authorized representative who has indicated his/her understanding and acceptance.   Dental Advisory Given and Dental advisory given  Plan Discussed with: CRNA and Surgeon  Anesthesia Plan Comments: (  Discussed general anesthesia, including possible nausea, instrumentation of airway, sore throat,pulmonary aspiration, etc. I asked if the were any outstanding questions, or  concerns before we proceeded.)        Anesthesia Quick Evaluation  

## 2016-05-29 NOTE — Anesthesia Procedure Notes (Signed)
Procedure Name: Intubation Date/Time: 05/29/2016 10:35 AM Performed by: Valda Favia Pre-anesthesia Checklist: Patient identified, Emergency Drugs available, Suction available, Patient being monitored and Timeout performed Patient Re-evaluated:Patient Re-evaluated prior to inductionOxygen Delivery Method: Circle system utilized Preoxygenation: Pre-oxygenation with 100% oxygen Intubation Type: IV induction Ventilation: Mask ventilation without difficulty Laryngoscope Size: Mac and 4 Grade View: Grade I Tube type: Oral Tube size: 7.0 mm Number of attempts: 1 Airway Equipment and Method: Stylet and LTA kit utilized Placement Confirmation: ETT inserted through vocal cords under direct vision,  positive ETCO2 and breath sounds checked- equal and bilateral Secured at: 20 cm Tube secured with: Tape Dental Injury: Teeth and Oropharynx as per pre-operative assessment

## 2016-05-30 ENCOUNTER — Encounter (HOSPITAL_COMMUNITY): Payer: Self-pay | Admitting: General Surgery

## 2016-05-30 LAB — COMPREHENSIVE METABOLIC PANEL
ALT: 144 U/L — ABNORMAL HIGH (ref 14–54)
ANION GAP: 13 (ref 5–15)
AST: 79 U/L — AB (ref 15–41)
Albumin: 4 g/dL (ref 3.5–5.0)
Alkaline Phosphatase: 114 U/L (ref 38–126)
BILIRUBIN TOTAL: 0.8 mg/dL (ref 0.3–1.2)
CHLORIDE: 103 mmol/L (ref 101–111)
CO2: 22 mmol/L (ref 22–32)
Calcium: 9.3 mg/dL (ref 8.9–10.3)
Creatinine, Ser: 0.89 mg/dL (ref 0.44–1.00)
GFR calc Af Amer: >60 mL/min (ref >60)
Glucose, Bld: 110 mg/dL — ABNORMAL HIGH (ref 65–99)
POTASSIUM: 3.5 mmol/L (ref 3.5–5.1)
Sodium: 138 mmol/L (ref 135–145)
TOTAL PROTEIN: 7.4 g/dL (ref 6.5–8.1)

## 2016-05-30 LAB — CBC
HEMATOCRIT: 35.3 % — AB (ref 36.0–46.0)
Hemoglobin: 10.8 g/dL — ABNORMAL LOW (ref 12.0–15.0)
MCH: 22.3 pg — ABNORMAL LOW (ref 26.0–34.0)
MCHC: 30.6 g/dL (ref 30.0–36.0)
MCV: 72.9 fL — ABNORMAL LOW (ref 78.0–100.0)
Platelets: 396 10*3/uL (ref 150–400)
RBC: 4.84 MIL/uL (ref 3.87–5.11)
RDW: 16.2 % — AB (ref 11.5–15.5)
WBC: 15.4 10*3/uL — ABNORMAL HIGH (ref 4.0–10.5)

## 2016-05-30 MED ORDER — OXYCODONE HCL 5 MG PO TABS
5.0000 mg | ORAL_TABLET | ORAL | Status: DC | PRN
Start: 2016-05-30 — End: 2016-05-30
  Administered 2016-05-30: 5 mg via ORAL
  Filled 2016-05-30: qty 1

## 2016-05-30 MED ORDER — OXYCODONE-ACETAMINOPHEN 5-325 MG PO TABS: 1.0000 | ORAL_TABLET | ORAL | 0 refills | Status: DC | PRN

## 2016-05-30 MED ORDER — HYDROMORPHONE HCL 1 MG/ML IJ SOLN: 1.0000 mg | INTRAMUSCULAR | Status: DC | PRN

## 2016-05-30 NOTE — Discharge Instructions (Signed)

## 2016-05-30 NOTE — Discharge Summary (Signed)
[facility] Discharge Summary   Patient ID: Destiny Stuart MRN: [id number] DOB/AGE: 01/06/1996 21 y.o.  Admit date: 05/27/2016 Discharge date: 05/30/2016  Admitting Diagnosis: Symptomatic cholelithiasis Choledocholithiasis Hyperbilirubinemia  Transaminitis  Discharge Diagnosis Patient Active Problem List   Diagnosis Date Noted  . Elevated liver enzymes 05/27/2016  . Dilated bile duct 05/27/2016  . Cholelithiasis without cholecystitis 05/27/2016  . 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 Cholelithiasis   . SVD (spontaneous vaginal delivery) 03/08/2016  . Pre-eclampsia 03/07/2016  . Pre-diabetes 10/22/2010  . Goiter 10/22/2010    Consultants Dr. Collene Mares Gastroenterology  Imaging: Dg Ercp Biliary & Pancreatic Ducts  Result Date: 05/28/2016 CLINICAL DATA:  ERCP EXAM: ERCP TECHNIQUE: Multiple spot images obtained with the fluoroscopic device and submitted for interpretation post-procedure. FLUOROSCOPY TIME:  Fluoroscopy Time:  2 minutes and 37 seconds. Radiation Exposure Index (if provided by the fluoroscopic device): Not given Number of Acquired Spot Images: 4 COMPARISON:  None. FINDINGS: Contrast fills the biliary tree. The common bile duct is dilated. Sphincterotomy device is noted. IMPRESSION: ERCP for sphincterotomy. These images were submitted for radiologic interpretation only. Please see the procedural report for the amount of contrast and the fluoroscopy time utilized. Electronically Signed   By: Marybelle Killings M.D.   On: 05/28/2016 13:51    Procedures Dr. Rosendo Gros (05/29/2016) - Laparoscopic Cholecystectomy  Dr. Benson Norway (05/28/2016) - ERCP  Hospital Course:  Pt is a 21 year old female who presented to [facility] with epigastric abdominal pain.  Workup showed cholelithiasis, Hyperbilirubinemia, and Transaminitis .  Patient was admitted and underwent procedures listed above.  Tolerated procedure well and was transferred to the floor.  Diet was advanced as tolerated.  On POD#1,  the patient was voiding well, tolerating diet, ambulating well, pain well controlled, vital signs stable, incisions c/d/i and felt stable for discharge home. Pt had mild leukocytosis without fever on day of discharge. This is likely 2/2 to stress from surgery. Instructed pt to call our office or come to the ED if she experiences fever, worsening pain, nausea or vomiting or other concerning symptoms. Patient will follow up in our office in 2 weeks and knows to call with questions or concerns.  She will call to confirm appointment date/time. Pt was discharged in good condition.      Allergies as of 05/30/2016      Reactions   Iodine Hives   Latex Hives   Peanut-containing Drug Products Hives      Medication List    TAKE these medications   KYLEENA 19.5 MG Iud Generic drug:  Levonorgestrel by Intrauterine route once. Implanted May 02, 2016   NIFEdipine 60 MG 24 hr tablet Commonly known as:  PROCARDIA XL/ADALAT-CC Take 1 tablet (60 mg total) by mouth daily.   oxyCODONE-acetaminophen 5-325 MG tablet Commonly known as:  ROXICET Take 1-2 tablets by mouth every 4 (four) hours as needed.   oxyCODONE-acetaminophen 5-325 MG tablet Commonly known as:  PERCOCET Take 1 tablet by mouth every 4 (four) hours as needed for severe pain.   PAMPRIN MAX PAIN FORMULA 500-25-15 MG Tabs Generic drug:  APAP-Pamabrom-Pyrilamine Take 2 tablets by mouth daily as needed (cramps).   prenatal multivitamin Tabs tablet Take 1 tablet by mouth daily at 12 noon.        Follow-up [facility], [place]. Go on 06/18/2016.   Specialty:  General Surgery Why:  06/18/16 @ 10:15.  Pt to arrive 30mins prior.  Contact information: [place]  [place] [phone]          Signed: [facility] 05/30/2016, 10:51 AM Pager: [phone] Consults: [phone] Mon-Fri 7:00 am-4:30 pm Sat-Sun 7:00 am-11:30  am

## 2016-05-30 NOTE — Progress Notes (Signed)
D/c instructions reviewed with pt by Lurena Joiner, unit RN. Copy of instructions and script given to pt by that RN. Pt d/c'd with belongings with family, via wheelchair escorted by hospital staff.

## 2016-05-30 NOTE — Progress Notes (Signed)
[facility] Progress Note  1 Day Post-Op  Subjective: Pt is feeling good. Minimal abdominal pain. No nausea or vomiting. No flatus.  Objective: Vital signs in last 24 hours: Temp:  [97.4 F (36.3 C)-98.8 F (37.1 C)] 98.3 F (36.8 C) (01/25 0355) Pulse Rate:  [82-101] 86 (01/25 0355) Resp:  [13-31] 19 (01/25 0355) BP: (126-155)/(62-99) 126/62 (01/25 0355) SpO2:  [97 %-100 %] 100 % (01/25 0355) Weight:  [184 lb 1.4 oz (83.5 kg)] 184 lb 1.4 oz (83.5 kg) (01/24 2147) Last BM Date: 05/26/16  Intake/Output from previous day: 01/24 0701 - 01/25 0700 In: 3045 [I.V.:3045] Out: 10 [Blood:10] Intake/Output this shift: No intake/output data recorded.  PE: Gen:  Alert, NAD, pleasant, cooperative Card:  RRR, no M/G/R heard Pulm:  CTA, no W/R/R, effort normal Abd: Soft, nondistended +BS, incisions C/D/I, appropriate tenderness around incisions Skin: no rashes noted, warm and dry  Lab Results:   Recent Labs  05/28/16 0522  WBC 7.4  HGB 10.5*  HCT 34.5*  PLT 333   BMET  Recent Labs  05/28/16 0522  NA 139  K 3.7  CL 105  CO2 25  GLUCOSE 88  BUN 5*  CREATININE 0.94  CALCIUM 9.2   PT/INR  Recent Labs  05/28/16 0522  LABPROT 14.1  INR 1.09   CMP     Component Value Date/Time   NA 139 05/28/2016 0522   K 3.7 05/28/2016 0522   CL 105 05/28/2016 0522   CO2 25 05/28/2016 0522   GLUCOSE 88 05/28/2016 0522   BUN 5 (L) 05/28/2016 0522   CREATININE 0.94 05/28/2016 0522   CALCIUM 9.2 05/28/2016 0522   PROT 6.8 05/28/2016 0522   ALBUMIN 3.6 05/28/2016 0522   AST 312 (H) 05/28/2016 0522   ALT 254 (H) 05/28/2016 0522   ALKPHOS 133 (H) 05/28/2016 0522   BILITOT 2.3 (H) 05/28/2016 0522   GFRNONAA >60 05/28/2016 0522   GFRAA >60 05/28/2016 0522   Lipase     Component Value Date/Time   LIPASE 21 05/27/2016 0748       Studies/Results: Dg Ercp Biliary & Pancreatic Ducts  Result Date: 05/28/2016 CLINICAL DATA:  ERCP EXAM: ERCP TECHNIQUE:  Multiple spot images obtained with the fluoroscopic device and submitted for interpretation post-procedure. FLUOROSCOPY TIME:  Fluoroscopy Time:  2 minutes and 37 seconds. Radiation Exposure Index (if provided by the fluoroscopic device): Not given Number of Acquired Spot Images: 4 COMPARISON:  None. FINDINGS: Contrast fills the biliary tree. The common bile duct is dilated. Sphincterotomy device is noted. IMPRESSION: ERCP for sphincterotomy. These images were submitted for radiologic interpretation only. Please see the procedural report for the amount of contrast and the fluoroscopy time utilized. Electronically Signed   By: Marybelle Killings M.D.   On: 05/28/2016 13:51    Anti-infectives: Anti-infectives    Start     Dose/Rate Route Frequency Ordered Stop   05/29/16 0800  ceFAZolin (ANCEF) IVPB 2g/100 mL premix     2 g 200 mL/hr over 30 Minutes Intravenous To Surgery 05/28/16 1502 05/30/16 0800       Assessment/Plan   Symptomatic cholelithiasis, suspect choledocholithiasis Transaminitis  Hyperbilirubinemia  S/P LAPAROSCOPIC CHOLECYSTECTOMY, Ralene Ok, 05/29/16, POD#1  - GI performed ERCP and removed one stone - Am labs pending to check LFT's and bilirubin  VTE: SCD's FEN: reg diet, PO pain meds  Plan: Will go home today pending labs.    LOS: 1 day    Kalman Drape , [facility]  [facility] 05/30/2016, 8:29 AM Pager: [phone] Consults: [phone] Mon-Fri 7:00 am-4:30 pm Sat-Sun 7:00 am-11:30 am

## 2017-01-20 DIAGNOSIS — Z7689 Persons encountering health services in other specified circumstances: Secondary | ICD-10-CM | POA: Diagnosis not present

## 2017-01-20 DIAGNOSIS — B977 Papillomavirus as the cause of diseases classified elsewhere: Secondary | ICD-10-CM | POA: Diagnosis not present

## 2018-05-24 IMAGING — RF DG ERCP WO/W SPHINCTEROTOMY
1 series · 4 of 4 positions shown · non-contrast
Comparison: None.

CLINICAL DATA: ERCP

EXAM:
ERCP
TECHNIQUE: Multiple spot images obtained with the fluoroscopic device and
submitted for interpretation post-procedure.
FLUOROSCOPY TIME:  Fluoroscopy Time:  2 minutes and 37 seconds.
Radiation Exposure Index (if provided by the fluoroscopic device):
Not given
Number of Acquired Spot Images: 4

[Series 1: run · 4 of 4 slices shown]
[im 1/4]
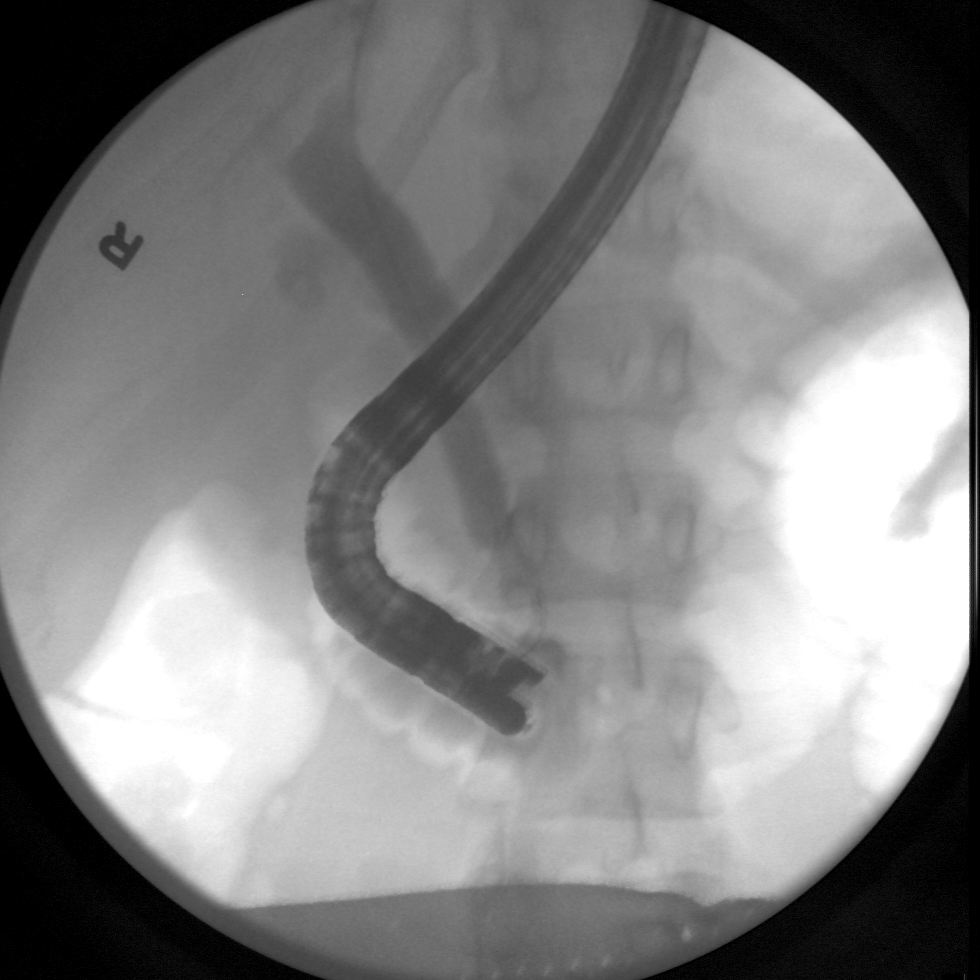
[im 2/4]
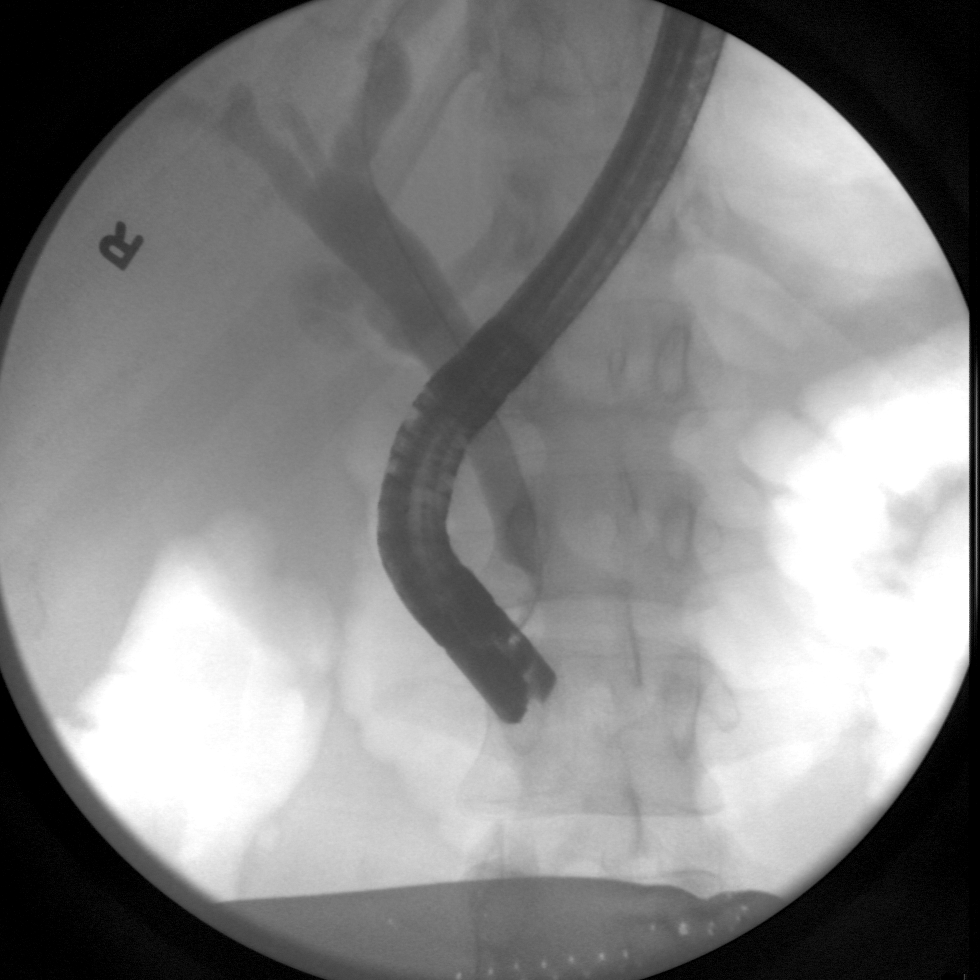
[im 3/4]
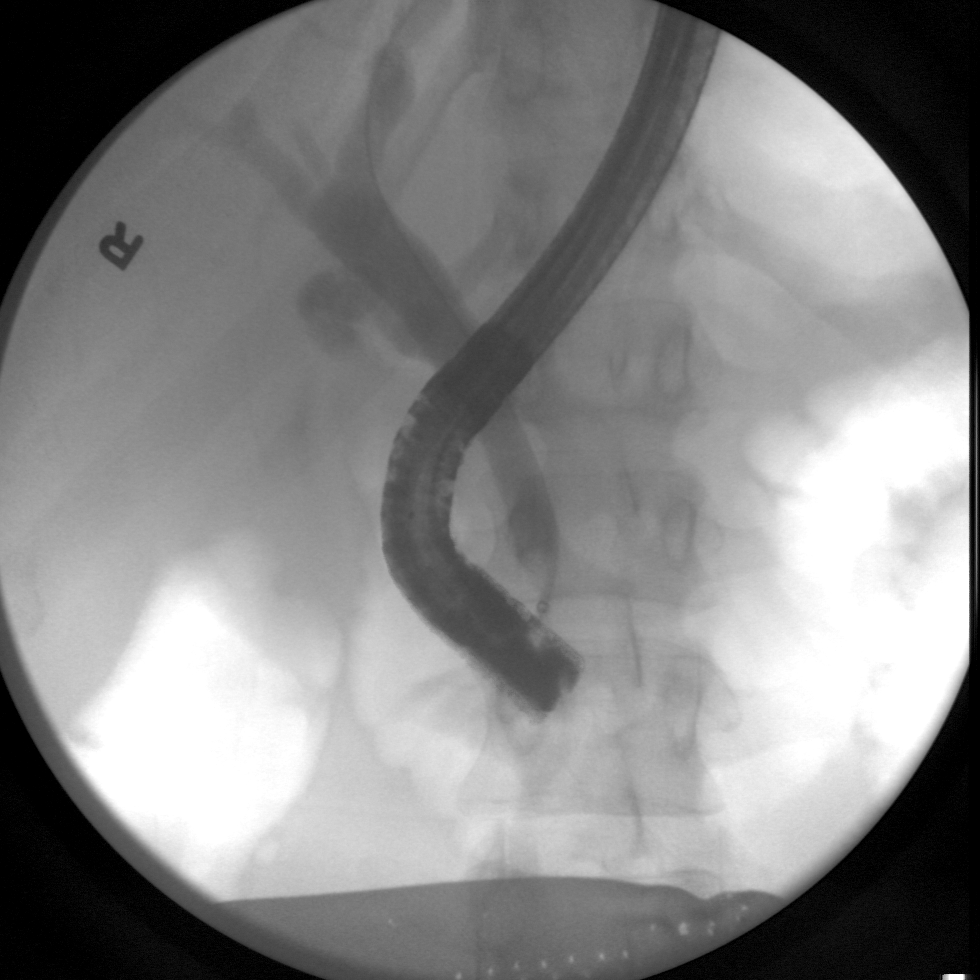
[im 4/4]
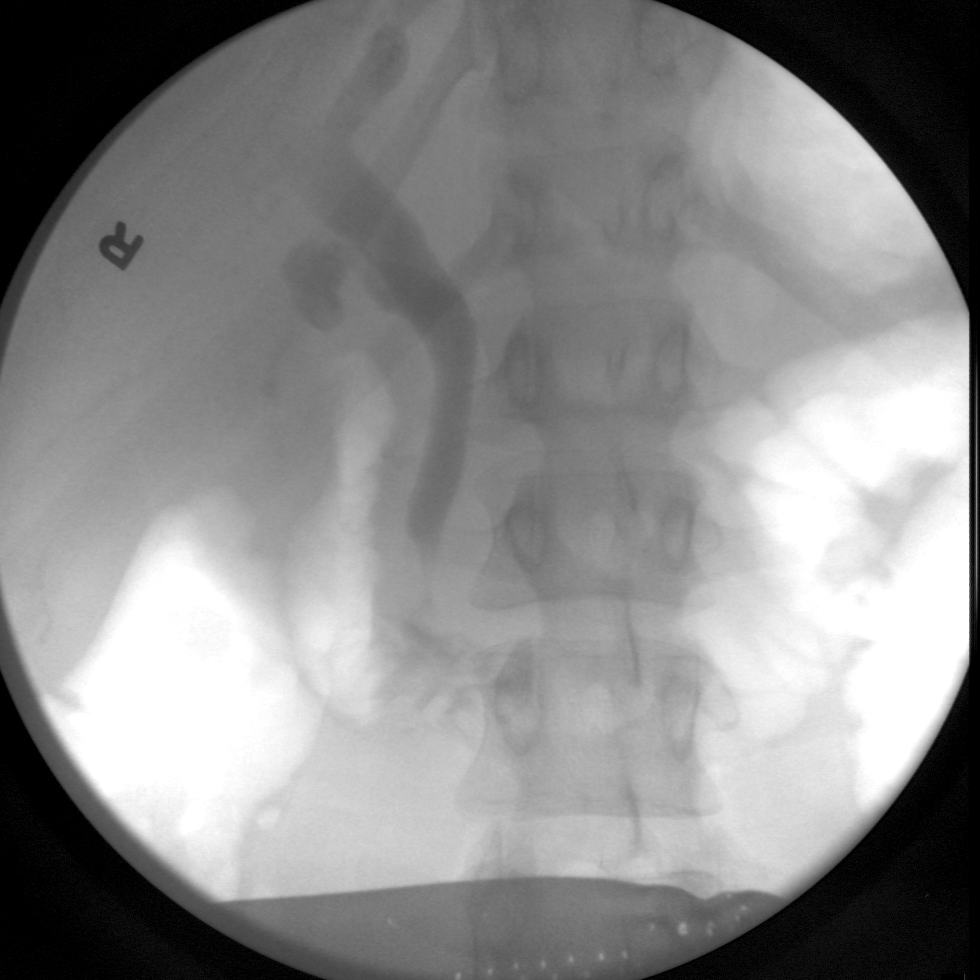

[4 of 4 positions shown; findings below may reference images not displayed]

FINDINGS: Contrast fills the biliary tree. The common bile duct is dilated.
Sphincterotomy device is noted.
IMPRESSION: ERCP for sphincterotomy.

These images were submitted for radiologic interpretation only.
Please see the procedural report for the amount of contrast and the
fluoroscopy time utilized.

## 2018-07-19 IMAGING — US US ABDOMEN LIMITED
1 series · 14 of 25 positions shown · non-contrast
Comparison: None.

CLINICAL DATA: Right upper quadrant abdominal pain, nausea,
vomiting and diarrhea for the past 3 weeks.

EXAM:
US ABDOMEN LIMITED - RIGHT UPPER QUADRANT

[Series 1: us abdomen limited · 0.25mm/px · 14 of 46 slices shown]
[im 1/46]
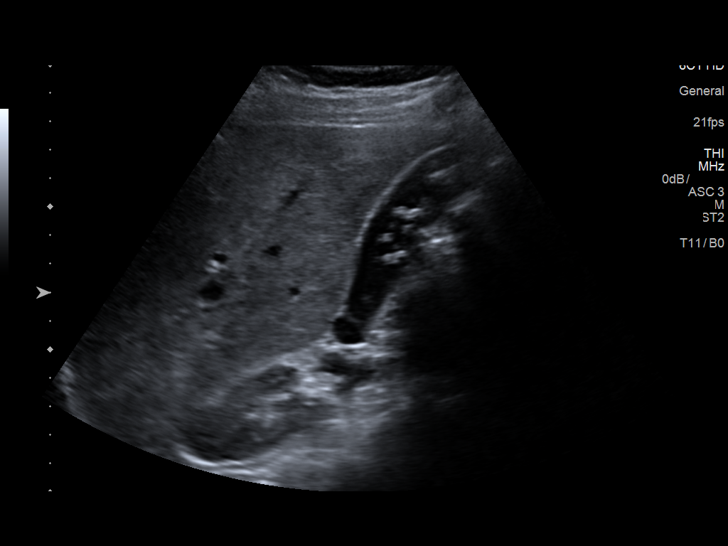
[im 4/46]
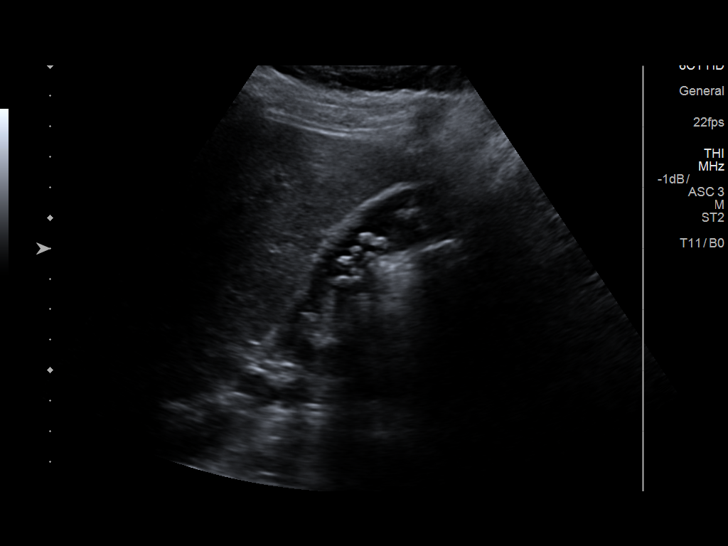
[im 8/46]
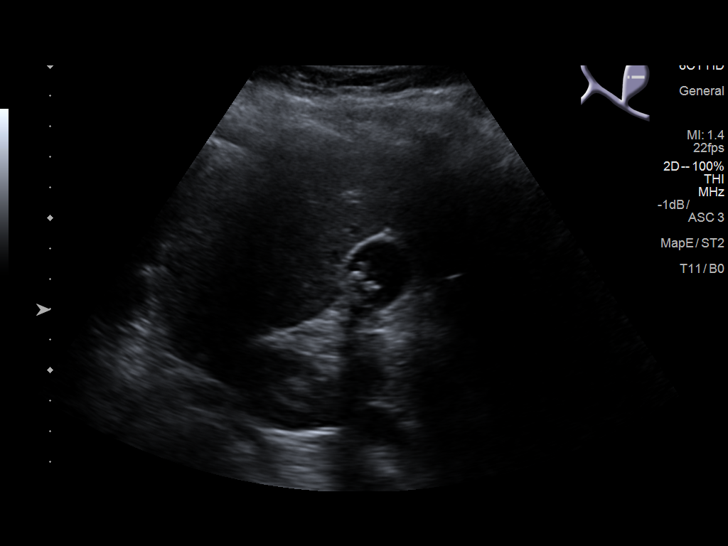
[im 12/46]
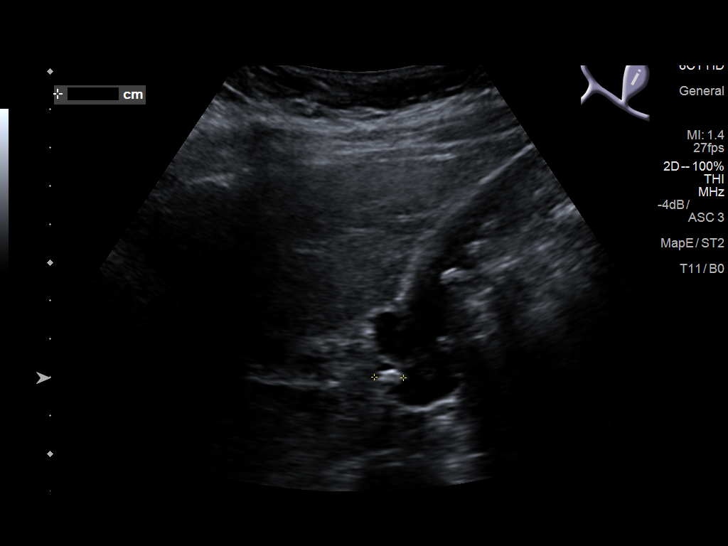
[im 16/46]
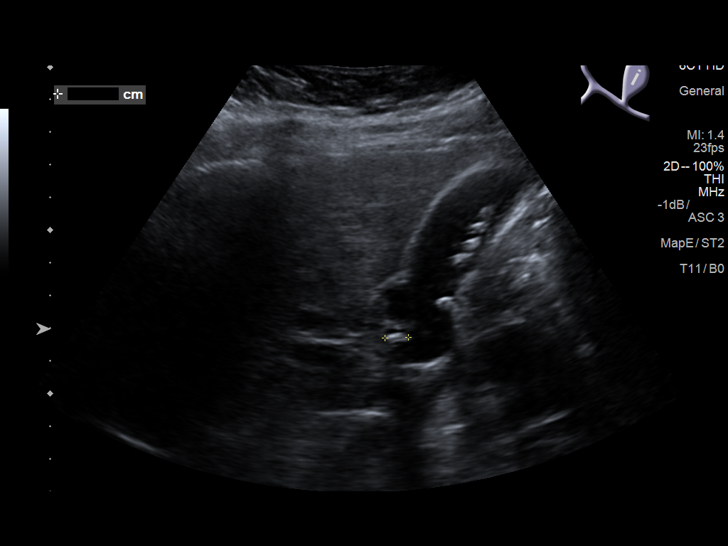
[im 17/46]
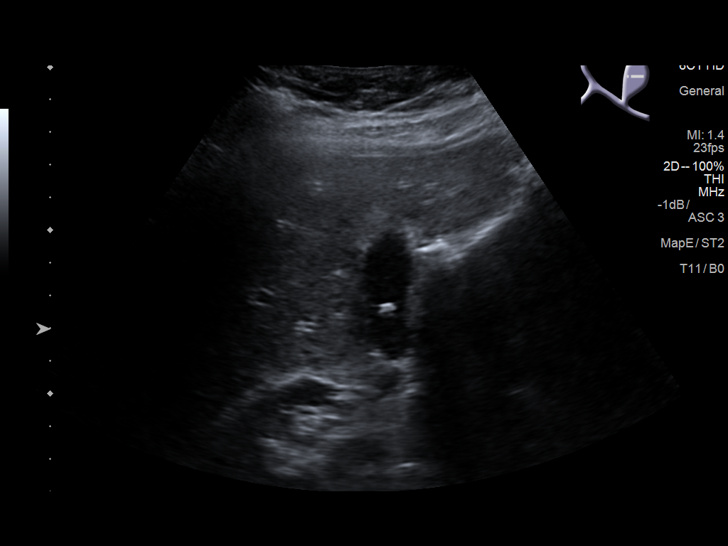
[im 21/46]
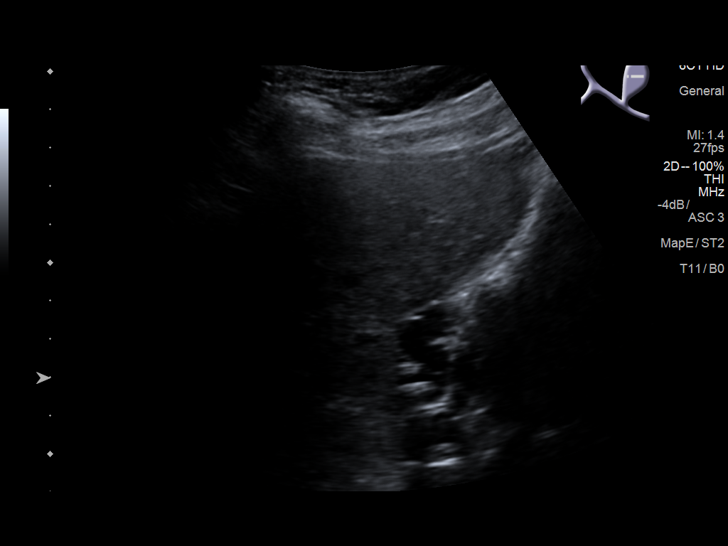
[im 25/46]
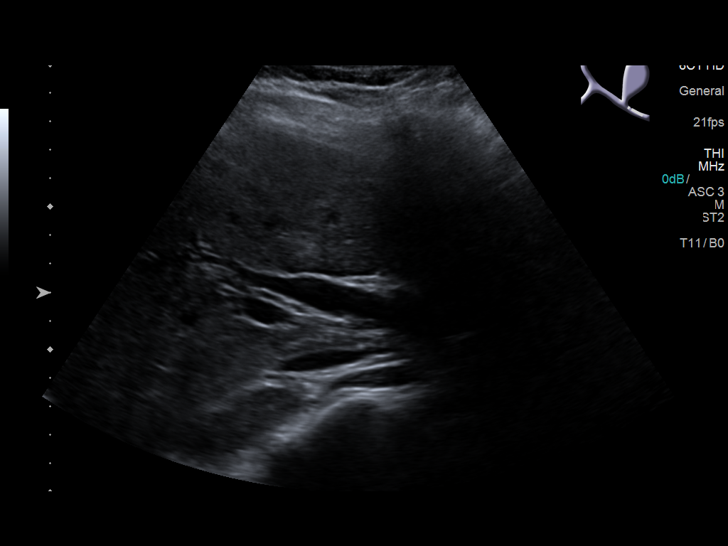
[im 29/46]
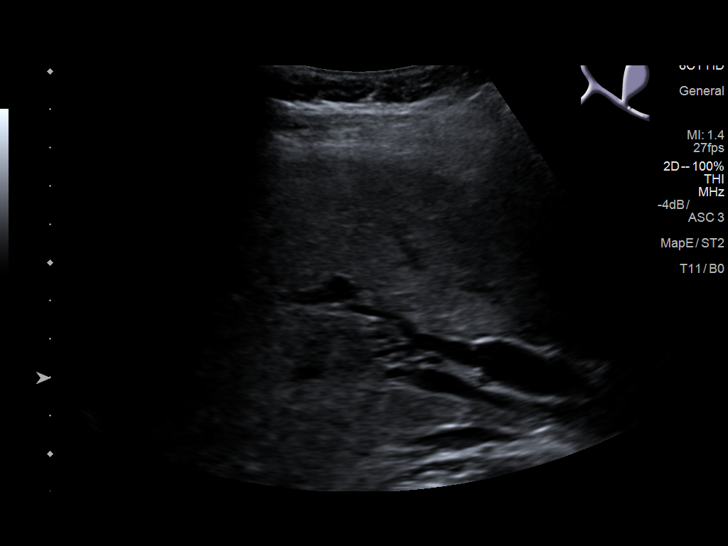
[im 31/46]
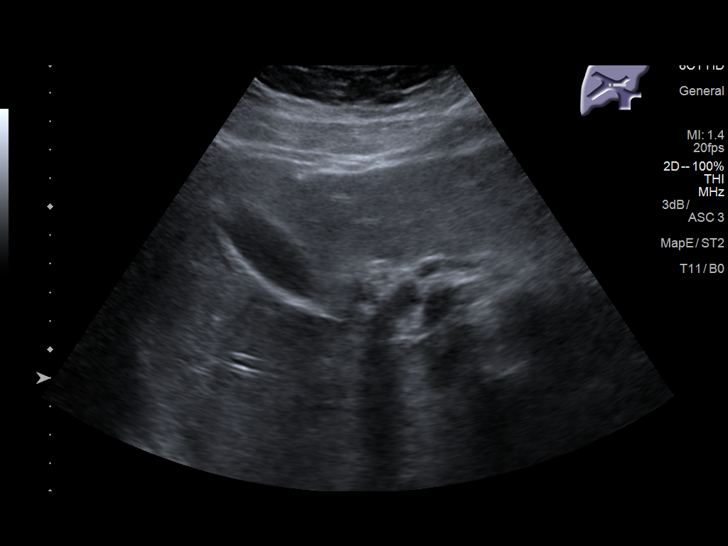
[im 34/46]
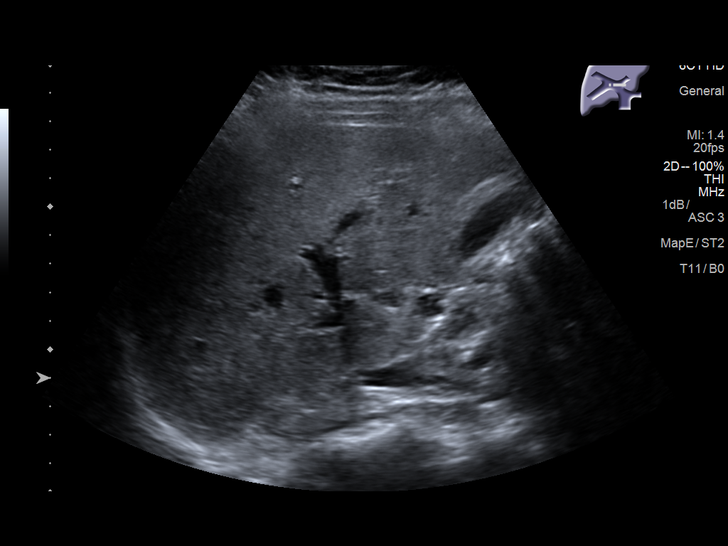
[im 38/46]
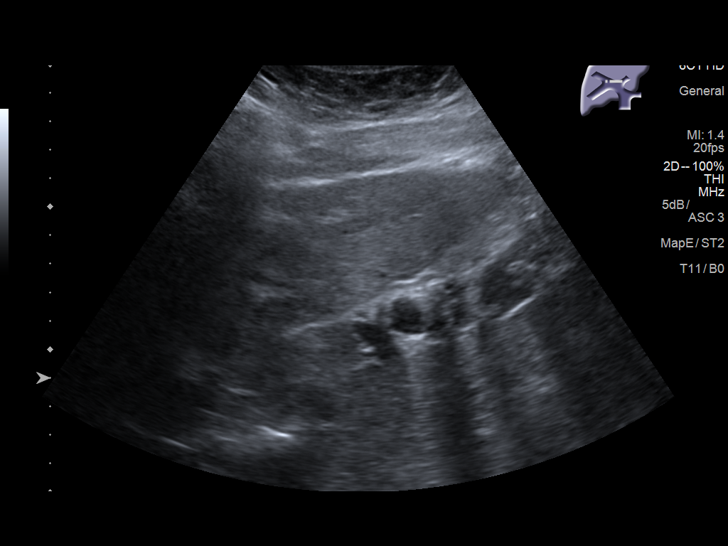
[im 42/46]
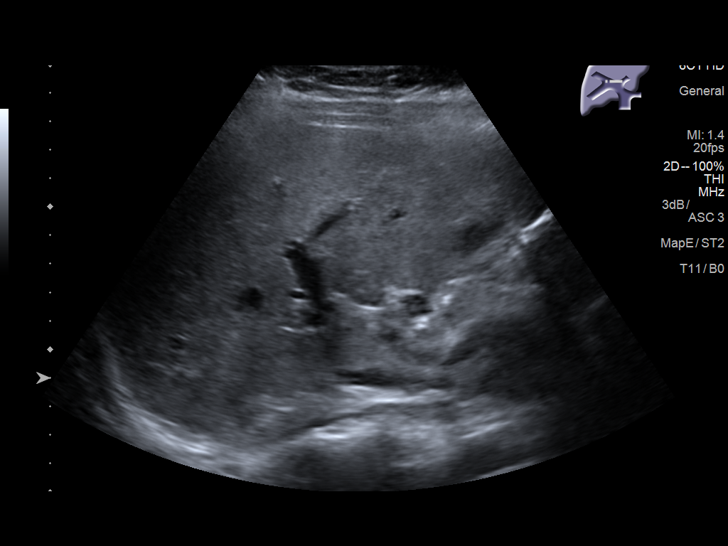
[im 46/46]
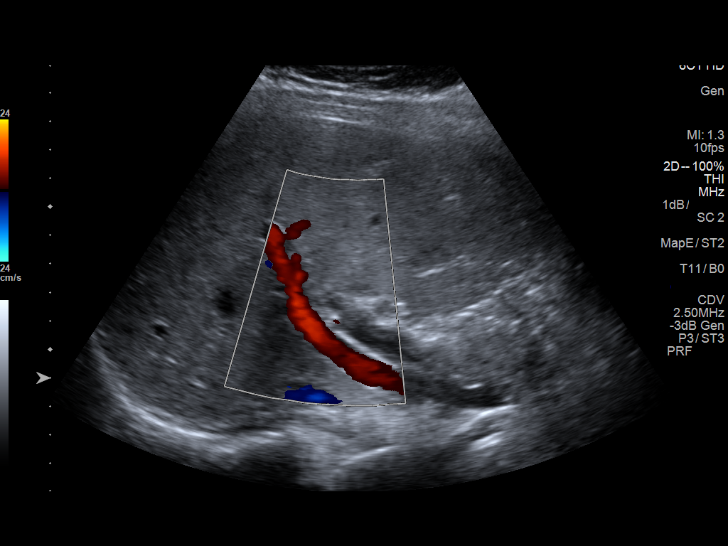

[14 of 25 positions shown; findings below may reference images not displayed]

FINDINGS: Gallbladder:

Multiple gallstones in the gallbladder measuring up to 8 mm in
maximum diameter each. No gallbladder wall thickening or
pericholecystic fluid. No sonographic Murphy's sign.

Common bile duct:

Diameter: 10.1 mm

Liver:

No focal lesion identified. Within normal limits in parenchymal
echogenicity.
IMPRESSION: 1. Cholelithiasis without evidence of cholecystitis.
2. Dilated common duct. This is suspicious for a nonvisualized
distal common duct stone or stones. A nonvisualized mass or
stricture is less likely.

## 2018-10-14 ENCOUNTER — Other Ambulatory Visit: Payer: Self-pay

## 2018-10-14 ENCOUNTER — Encounter (HOSPITAL_COMMUNITY): Payer: Self-pay | Admitting: Emergency Medicine

## 2018-10-14 ENCOUNTER — Inpatient Hospital Stay (HOSPITAL_COMMUNITY)
Admission: EM | Admit: 2018-10-14 | Discharge: 2018-10-14 | Disposition: A | Discharge status: Home / Self Care | Payer: Federal, State, Local not specified - PPO | Attending: Obstetrics & Gynecology | Admitting: Obstetrics & Gynecology

## 2018-10-14 ENCOUNTER — Inpatient Hospital Stay (HOSPITAL_COMMUNITY): Payer: Federal, State, Local not specified - PPO

## 2018-10-14 ENCOUNTER — Ambulatory Visit: Payer: Self-pay | Admitting: *Deleted

## 2018-10-14 DIAGNOSIS — O035 Genital tract and pelvic infection following complete or unspecified spontaneous abortion: Secondary | ICD-10-CM | POA: Diagnosis not present

## 2018-10-14 DIAGNOSIS — R102 Pelvic and perineal pain: Secondary | ICD-10-CM | POA: Diagnosis present

## 2018-10-14 DIAGNOSIS — N939 Abnormal uterine and vaginal bleeding, unspecified: Secondary | ICD-10-CM | POA: Diagnosis not present

## 2018-10-14 DIAGNOSIS — Z8759 Personal history of other complications of pregnancy, childbirth and the puerperium: Secondary | ICD-10-CM | POA: Diagnosis not present

## 2018-10-14 DIAGNOSIS — A599 Trichomoniasis, unspecified: Secondary | ICD-10-CM

## 2018-10-14 LAB — I-STAT BETA HCG BLOOD, ED (MC, WL, AP ONLY): I-stat hCG, quantitative: 42.1 m[IU]/mL — ABNORMAL HIGH (ref <5)

## 2018-10-14 LAB — BASIC METABOLIC PANEL
Anion gap: 11 (ref 5–15)
BUN: 11 mg/dL (ref 6–20)
CO2: 22 mmol/L (ref 22–32)
Calcium: 9.1 mg/dL (ref 8.9–10.3)
Chloride: 105 mmol/L (ref 98–111)
Creatinine, Ser: 0.76 mg/dL (ref 0.44–1.00)
GFR calc Af Amer: >60 mL/min (ref >60)
GFR calc non Af Amer: >60 mL/min (ref >60)
Glucose, Bld: 79 mg/dL (ref 70–99)
Potassium: 4 mmol/L (ref 3.5–5.1)
Sodium: 138 mmol/L (ref 135–145)

## 2018-10-14 LAB — WET PREP, GENITAL
Clue Cells Wet Prep HPF POC: NONE SEEN
Sperm: NONE SEEN
Yeast Wet Prep HPF POC: NONE SEEN

## 2018-10-14 LAB — CBC WITH DIFFERENTIAL/PLATELET
Abs Immature Granulocytes: 0.01 10*3/uL (ref 0.00–0.07)
Basophils Absolute: 0.1 10*3/uL (ref 0.0–0.1)
Basophils Relative: 1 %
Eosinophils Absolute: 0.2 10*3/uL (ref 0.0–0.5)
Eosinophils Relative: 2 %
HCT: 39.5 % (ref 36.0–46.0)
Hemoglobin: 12.6 g/dL (ref 12.0–15.0)
Immature Granulocytes: 0 %
Lymphocytes Relative: 38 %
Lymphs Abs: 3.7 10*3/uL (ref 0.7–4.0)
MCH: 27.2 pg (ref 26.0–34.0)
MCHC: 31.9 g/dL (ref 30.0–36.0)
MCV: 85.3 fL (ref 80.0–100.0)
Monocytes Absolute: 0.7 10*3/uL (ref 0.1–1.0)
Monocytes Relative: 7 %
Neutro Abs: 5.1 10*3/uL (ref 1.7–7.7)
Neutrophils Relative %: 52 %
Platelets: 285 10*3/uL (ref 150–400)
RBC: 4.63 MIL/uL (ref 3.87–5.11)
RDW: 15.2 % (ref 11.5–15.5)
WBC: 9.7 10*3/uL (ref 4.0–10.5)
nRBC: 0 % (ref 0.0–0.2)

## 2018-10-14 LAB — URINALYSIS, ROUTINE W REFLEX MICROSCOPIC
Bacteria, UA: NONE SEEN
Bilirubin Urine: NEGATIVE
Glucose, UA: NEGATIVE mg/dL
Ketones, ur: NEGATIVE mg/dL
Leukocytes,Ua: NEGATIVE
Nitrite: NEGATIVE
Protein, ur: NEGATIVE mg/dL
Specific Gravity, Urine: 1.011 (ref 1.005–1.030)
pH: 7 (ref 5.0–8.0)

## 2018-10-14 MED ORDER — METRONIDAZOLE 500 MG PO TABS
2000.0000 mg | ORAL_TABLET | Freq: Once | ORAL | Status: AC
  Administered 2018-10-14: 2000 mg via ORAL
  Filled 2018-10-14: qty 4

## 2018-10-14 NOTE — Telephone Encounter (Signed)
Pt reports had abortion 09/04/2018 "Pill inserted vaginally." States has had vaginal bleeding since. Reports heavy with clots at times. Reports dizziness at times. Lower abdominal "Shooting pain" at times. States afebrile. Reports called [facility] who directed her to ED. Pt states calling to make sure she does need to go to ED. Reiterated need for ED eval. Pt states will follow disposition.   Reason for Disposition . SEVERE vaginal bleeding (i.e., soaking 2 pads or tampons per hour and present 2 or more hours; 1 menstrual cup every 2 hours)  Answer Assessment - Initial Assessment Questions 1. AMOUNT: "Describe the bleeding that you are having."    - SPOTTING: spotting, or pinkish / brownish mucous discharge; does not fill panti-liner or pad    - MILD:  less than 1 pad / hour; less than patient's usual menstrual bleeding   - MODERATE: 1-2 pads / hour; 1 menstrual cup every 6 hours; small-medium blood clots (e.g., pea, grape, small coin)   - SEVERE: soaking 2 or more pads/hour for 2 or more hours; 1 menstrual cup every 2 hours; bleeding not contained by pads or continuous red blood from vagina; large blood clots (e.g., golf ball, large coin)      Varies 2. ONSET: "When did the bleeding begin?" "Is it continuing now?"     09/04/2018 post abortion 3. MENSTRUAL PERIOD: "When was the last normal menstrual period?" "How is this different than your period?"      4. REGULARITY: "How regular are your periods?"      5. ABDOMINAL PAIN: "Do you have any pain?" "How bad is the pain?"  (e.g., Scale 1-10; mild, moderate, or severe)   - MILD (1-3): doesn't interfere with normal activities, abdomen soft and not tender to touch    - MODERATE (4-7): interferes with normal activities or awakens from sleep, tender to touch    - SEVERE (8-10): excruciating pain, doubled over, unable to do any normal activities     Moderate, shootiing at times 6. PREGNANCY: "Could you be pregnant?" "Are you sexually active?"  "Did you recently give birth?"     Abortion 09/04/2018, "vaginal pill  Insert" 7. BREASTFEEDING: "Are you breastfeeding?"      8. HORMONES: "Are you taking any hormone medications, prescription or OTC?" (e.g., birth control pills, estrogen)      9. BLOOD THINNERS: "Do you take any blood thinners?" (e.g., Coumadin/warfarin, Pradaxa/dabigatran, aspirin)     no 10. CAUSE: "What do you think is causing the bleeding?" (e.g., recent gyn surgery, recent gyn procedure; known bleeding disorder, cervical cancer, polycystic ovarian disease, fibroids)        above 11. HEMODYNAMIC STATUS: "Are you weak or feeling lightheaded?" If so, ask: "Can you stand and walk normally?"        Dizzy at times 12. OTHER SYMPTOMS: "What other symptoms are you having with the bleeding?" (e.g., passed tissue, vaginal discharge, fever, menstrual-type cramps)       Clots at times  Protocols used: VAGINAL BLEEDING - ABNORMAL-A-AH

## 2018-10-14 NOTE — Discharge Instructions (Signed)
Human Chorionic Gonadotropin Test Why am I having this test? A human chorionic gonadotropin (hCG) test is done to determine whether you are pregnant. It can also be used:  To diagnose an abnormal pregnancy.  To determine whether you have had a failed pregnancy (miscarriage) or are at risk of one. What is being tested? This test checks the level of the human chorionic gonadotropin (hCG) hormone in the blood. This hormone is produced during pregnancy by the cells that form the placenta. The placenta is the organ that grows inside your womb (uterus) to nourish a developing baby. When you are pregnant, hCG can be detected in your blood or urine 7 to 8 days before your missed period. It continues to go up for the first 8-10 weeks of pregnancy. The presence of hCG in your blood can be measured with several different types of tests. You may have:  A urine test. ? Because this hormone is eliminated from your body by your kidneys, you may have a urine test to find out whether you are pregnant. A home pregnancy test detects whether there is hCG in your urine. ? A urine test only shows whether there is hCG in your urine. It does not measure how much.  A qualitative blood test. ? You may have this type of blood test to find out if you are pregnant. ? This blood test only shows whether there is hCG in your blood. It does not measure how much.  A quantitative blood test. ? This type of blood test measures the amount of hCG in your blood. ? You may have this test to:  Diagnose an abnormal pregnancy.  Check whether you have had a miscarriage.  Determine whether you are at risk of a miscarriage. What kind of sample is taken?     Two kinds of samples may be collected to test for the hCG hormone.  Blood. It is usually collected by inserting a needle into a blood vessel.  Urine. It is usually collected by urinating into a germ-free (sterile) specimen cup. It is best to collect the sample the first  time you urinate in the morning. How do I prepare for this test? No preparation is needed for a blood test.  For the urine test:  Let your health care provider know about: ? All medicines you are taking, including vitamins, herbs, creams, and over-the-counter medicines. ? Any blood in your urine. This may interfere with the result.  Do not drink too much fluid. Drink as you normally would, or as directed by your health care provider. How are the results reported? Depending on the type of test that you have, your test results may be reported as values. Your health care provider will compare your results to normal ranges that were established after testing a large group of people (reference ranges). Reference ranges may vary among labs and hospitals. For this test, common reference ranges that show absence of pregnancy are:  Quantitative hCG blood levels: less than 5 IU/L. Other results will be reported as either positive or negative. For this test, normal results (meaning the absence of pregnancy) are:  Negative for hCG in the urine test.  Negative for hCG in the qualitative blood test. What do the results mean? Urine and qualitative blood test  A negative result could mean: ? That you are not pregnant. ? That the test was done too early in your pregnancy to detect hCG in your blood or urine. If you still have other signs   of pregnancy, the test will be repeated.  A positive result means: ? That you are most likely pregnant. Your health care provider may confirm your pregnancy with an imaging study (ultrasound) of your uterus, if needed. Quantitative blood test Results of the quantitative hCG blood test will be interpreted as follows:  Less than 5 IU/L: You are most likely not pregnant.  Greater than 25 IU/L: You are most likely pregnant.  hCG levels that are higher than expected: ? You are pregnant with twins. ? You have abnormal growths in the uterus.  hCG levels that are  rising more slowly than expected: ? You have an ectopic pregnancy (also called a tubal pregnancy).  hCG levels that are falling: ? You may be having a miscarriage. Talk with your health care provider about what your results mean. Questions to ask your health care provider Ask your health care provider, or the department that is doing the test:  When will my results be ready?  How will I get my results?  What are my treatment options?  What other tests do I need?  What are my next steps? Summary  A human chorionic gonadotropin test is done to determine whether you are pregnant.  When you are pregnant, hCG can be detected in your blood or urine 7 to 8 days before your missed period. It continues to go up for the first 8-10 weeks of pregnancy.  Your hCG level can be measured with different types of tests. You may have a urine test, a qualitative blood test, or a quantitative blood test.  Talk with your health care provider about what your results mean. This information is not intended to replace advice given to you by your health care provider. Make sure you discuss any questions you have with your health care provider. Document Released: 05/24/2004 Document Revised: 03/24/2017 Document Reviewed: 03/24/2017 Elsevier Interactive Patient Education  2019 [employer].

## 2018-10-14 NOTE — ED Triage Notes (Signed)
Pt in with c/o vaginal bleeding since an abortion in early May.

## 2018-10-14 NOTE — MAU Note (Signed)
Pt presents to MAU from ED due to vaginal bleeding that has been ongoing. Pt had abortion on May 1st at the [facility] and has continued to bleed, some days heavier than others. Pt states that she did not go to follow up US. She has also had intermittent abdominal pain since then.

## 2018-10-14 NOTE — MAU Provider Note (Signed)
History     CSN: [id number]  Arrival date and time: 10/14/18 1136   First Provider Initiated Contact with Patient 10/14/18 1506     Chief Complaint  Patient presents with  . Vaginal Bleeding   HPI Destiny Stuart is a 23 y.o. [id number] who presents to MAU from [facility] for evaluation of vaginal bleeding and abdominal cramping following an abortion on 09/04/2018.  Patient states she has had "non-stop" bleeding and "cramping like a period" since her procedure. She denies sexual intercourse. She denies heavy vaginal bleeding, weakness, syncope, fever or abdominal tenderness. She has not taken medication or tried other treatments for these complaints.  Patient reports focal tenderness at left-midabdomen. She denies difficulty voiding or passing stool.   OB History    Gravida  4   Para  1   Term  1   Preterm      AB  2   Living  1     SAB      TAB  2   Ectopic      Multiple  0   Live Births  1           History reviewed. No pertinent past medical history.  Past Surgical History:  Procedure Laterality Date  . CHOLECYSTECTOMY N/A 05/29/2016   Procedure: LAPAROSCOPIC CHOLECYSTECTOMY;  Surgeon: Ralene Ok, MD;  Location: [place];  Service: General;  Laterality: N/A;  . ENDOSCOPIC RETROGRADE CHOLANGIOPANCREATOGRAPHY (ERCP) WITH PROPOFOL N/A 05/28/2016   Procedure: ENDOSCOPIC RETROGRADE CHOLANGIOPANCREATOGRAPHY (ERCP) WITH PROPOFOL;  Surgeon: Carol Ada, MD;  Location: [place];  Service: Endoscopy;  Laterality: N/A;  . FOOT SURGERY    . THERAPEUTIC ABORTION      History reviewed. No pertinent family history.  Social History   Tobacco Use  . Smoking status: Never Smoker  . Smokeless tobacco: Never Used  Substance Use Topics  . Alcohol use: No  . Drug use: No    Allergies:  Allergies  Allergen Reactions  . Iodine Hives  . Latex Hives  . Peanut-Containing Drug Products Hives    Medications Prior to Admission  Medication Sig Dispense Refill Last  Dose  . APAP-Pamabrom-Pyrilamine (PAMPRIN MAX PAIN FORMULA) 500-25-15 MG TABS Take 2 tablets by mouth daily as needed (cramps).   2-3 weeks ago  . Levonorgestrel (KYLEENA) 19.5 MG IUD by Intrauterine route once. Implanted May 02, 2016   May 02, 2016  . NIFEdipine (PROCARDIA XL/ADALAT-CC) 60 MG 24 hr tablet Take 1 tablet (60 mg total) by mouth daily. (Patient not taking: Reported on 05/27/2016) 30 tablet 1 Not Taking at Unknown time  . oxyCODONE-acetaminophen (PERCOCET) 5-325 MG tablet Take 1 tablet by mouth every 4 (four) hours as needed for severe pain. 20 tablet 0   . oxyCODONE-acetaminophen (ROXICET) 5-325 MG tablet Take 1-2 tablets by mouth every 4 (four) hours as needed. 30 tablet 0   . Prenatal Vit-Fe Fumarate-FA (PRENATAL MULTIVITAMIN) TABS tablet Take 1 tablet by mouth daily at 12 noon. (Patient not taking: Reported on 05/27/2016) 90 tablet 1 Not Taking at Unknown time    Review of Systems  Constitutional: Negative for chills, fatigue and fever.  Respiratory: Negative for shortness of breath.   Gastrointestinal: Positive for abdominal pain.  Genitourinary: Positive for vaginal bleeding.  Musculoskeletal: Negative for back pain.  Neurological: Negative for syncope, weakness and headaches.  All other systems reviewed and are negative.  Physical Exam   Blood pressure 138/85, pulse 78, temperature 98.7 F (37.1 C), temperature source Oral, resp. rate 18,  height 5\' 8"  (1.727 m), weight 69.4 kg, last menstrual period 07/20/2018, SpO2 100 %.  Physical Exam  Nursing note and vitals reviewed. Constitutional: She is oriented to person, place, and time. She appears well-developed and well-nourished.  Cardiovascular: Normal rate.  Respiratory: Effort normal.  GI: Soft. She exhibits no distension. There is no abdominal tenderness. There is no rebound and no guarding.  Neurological: She is alert and oriented to person, place, and time.  Skin: Skin is warm and dry.  Psychiatric: She has a  normal mood and affect. Her behavior is normal. Judgment and thought content normal.    MAU Course/MDM  Procedures   --Discussed with patient that with Quant hCG of 42 it is impossible to differentiate between declining Quant hCG from abortion and increasing quant associated with new pregnancy. Patient denies intercourse since prior to abortion. --Notified by ultrasound that patient was unable to tolerate full ultrasound and retracted consent prior to completion of scan.  --Patient states she "can't sit around like this" again and will not be able to accommodate a repeat Quant in MAU Friday through Sunday. She requests clinic appointment Monday morning.  Patient Vitals for the past 24 hrs:  BP Temp Temp src Pulse Resp SpO2 Height Weight  10/14/18 1727 136/76 - - - - - - -  10/14/18 1453 138/85 98.7 F (37.1 C) Oral 78 18 - - -  10/14/18 1151 - - - - - - 5\' 8"  (1.727 m) 69.4 kg  10/14/18 1143 130/75 98.3 F (36.8 C) Oral 83 18 100 % - -   Results for orders placed or performed during the hospital encounter of 10/14/18 (from the past 24 hour(s))  CBC with Differential     Status: None   Collection Time: 10/14/18 12:01 PM  Result Value Ref Range   WBC 9.7 4.0 - 10.5 K/uL   RBC 4.63 3.87 - 5.11 MIL/uL   Hemoglobin 12.6 12.0 - 15.0 g/dL   HCT 39.5 36.0 - 46.0 %   MCV 85.3 80.0 - 100.0 fL   MCH 27.2 26.0 - 34.0 pg   MCHC 31.9 30.0 - 36.0 g/dL   RDW 15.2 11.5 - 15.5 %   Platelets 285 150 - 400 K/uL   nRBC 0.0 0.0 - 0.2 %   Neutrophils Relative % 52 %   Neutro Abs 5.1 1.7 - 7.7 K/uL   Lymphocytes Relative 38 %   Lymphs Abs 3.7 0.7 - 4.0 K/uL   Monocytes Relative 7 %   Monocytes Absolute 0.7 0.1 - 1.0 K/uL   Eosinophils Relative 2 %   Eosinophils Absolute 0.2 0.0 - 0.5 K/uL   Basophils Relative 1 %   Basophils Absolute 0.1 0.0 - 0.1 K/uL   Immature Granulocytes 0 %   Abs Immature Granulocytes 0.01 0.00 - 0.07 K/uL  Basic metabolic panel     Status: None   Collection Time:  10/14/18 12:01 PM  Result Value Ref Range   Sodium 138 135 - 145 mmol/L   Potassium 4.0 3.5 - 5.1 mmol/L   Chloride 105 98 - 111 mmol/L   CO2 22 22 - 32 mmol/L   Glucose, Bld 79 70 - 99 mg/dL   BUN 11 6 - 20 mg/dL   Creatinine, Ser 0.76 0.44 - 1.00 mg/dL   Calcium 9.1 8.9 - 10.3 mg/dL   GFR calc non Af Amer >60 >60 mL/min   GFR calc Af Amer >60 >60 mL/min   Anion gap 11 5 - 15  I-Stat  Beta hCG blood, ED ([facility], [facility], [facility] only)     Status: Abnormal   Collection Time: 10/14/18  1:45 PM  Result Value Ref Range   I-stat hCG, quantitative 42.1 (H) <5 mIU/mL   Comment 3          Urinalysis, Routine w reflex microscopic     Status: Abnormal   Collection Time: 10/14/18  3:12 PM  Result Value Ref Range   Color, Urine STRAW (A) YELLOW   APPearance CLEAR CLEAR   Specific Gravity, Urine 1.011 1.005 - 1.030   pH 7.0 5.0 - 8.0   Glucose, UA NEGATIVE NEGATIVE mg/dL   Hgb urine dipstick MODERATE (A) NEGATIVE   Bilirubin Urine NEGATIVE NEGATIVE   Ketones, ur NEGATIVE NEGATIVE mg/dL   Protein, ur NEGATIVE NEGATIVE mg/dL   Nitrite NEGATIVE NEGATIVE   Leukocytes,Ua NEGATIVE NEGATIVE   RBC / HPF 0-5 0 - 5 RBC/hpf   WBC, UA 0-5 0 - 5 WBC/hpf   Bacteria, UA NONE SEEN NONE SEEN   Squamous Epithelial / LPF 0-5 0 - 5  Wet prep, genital     Status: Abnormal   Collection Time: 10/14/18  3:50 PM  Result Value Ref Range   Yeast Wet Prep HPF POC NONE SEEN NONE SEEN   Trich, Wet Prep PRESENT (A) NONE SEEN   Clue Cells Wet Prep HPF POC NONE SEEN NONE SEEN   WBC, Wet Prep HPF POC MODERATE (A) NONE SEEN   Sperm NONE SEEN    Meds ordered this encounter  Medications  . metroNIDAZOLE (FLAGYL) tablet 2,000 mg   US Ob Less Than 14 Weeks With Ob Transvaginal  Result Date: 10/14/2018 CLINICAL DATA:  Status post therapeutic abortion approximately 6 weeks ago. Recurrent pelvic pain and vaginal bleeding. Quantitative beta hCG of 42. EXAM: OBSTETRIC <14 WK [place] AND TRANSVAGINAL OB US TECHNIQUE: Both transabdominal and  transvaginal ultrasound examinations were performed for complete evaluation of the gestation as well as the maternal uterus, adnexal regions, and pelvic cul-de-sac. Transvaginal technique was performed to assess early pregnancy. COMPARISON:  None. FINDINGS: Intrauterine gestational sac: None Maternal uterus/adnexae: Endometrium has heterogeneous appearance and measures 15 mm in thickness. No internal blood flow seen on color Doppler ultrasound. No fibroids identified. A benign-appearing hemorrhagic left ovarian cyst is seen which measures 4.7 cm. Right ovary was not directly visualized, however no right adnexal mass identified. No abnormal free-fluid. IMPRESSION: Endometrial thickness measures 15 mm, but no focal endometrial mass or vascularity detected. Findings are nonspecific and could indicate endometrial blood products, although hypovascular retained products conception cannot be excluded. 4.7 cm benign-appearing hemorrhagic cyst in left ovary. Electronically Signed   By: Earle Gell M.D.   On: 10/14/2018 16:22   Assessment and Plan  --23 y.o. [id number] s/p abortion 09/04/18 --Quant hCG 42.1 --Pt poorly tolerate of ultrasound, unable to definitively rule out retained POC --Positive Trichomonas, treated in MAU, given paper rx for expedited partner treatment --Discharge home in stable condition  F/U: Patient to have repeat Quant Monday 06/15 at [place] at [facility] [place] 10/14/2018, 5:41 PM

## 2018-10-14 NOTE — ED Provider Notes (Signed)
[facility] DEPARTMENT Provider Note   CSN: [id number] Arrival date & time: 10/14/18  1136   History   Chief Complaint Chief Complaint  Patient presents with  . Vaginal Bleeding    HPI Destiny Stuart is a 23 y.o. female.     HPI    23 year old female presents today with complaints of vaginal bleeding.  Patient notes that in the middle of March she had her last menstrual cycle, she was approximately [redacted] weeks pregnant in early May when she was seen at [facility] choice on [address].  She is given an intravaginal pill for abortion.  She notes bleeding thereafter.  She notes that is been intermittent heavy at times lighter otherwise.  She notes proximally 1 week ago she had a large blood clot but since then has had light bleeding.  She called her primary care's office and told them that she was still having bleeding requesting an appointment as she did not want to come to the emergency room, they referred her to the emergency room for heavy vaginal bleeding which she denies.    History reviewed. No pertinent past medical history.  Patient Active Problem List   Diagnosis Date Noted  . Elevated liver enzymes 05/27/2016  . Dilated bile duct 05/27/2016  . Cholelithiasis without cholecystitis 05/27/2016  . 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 Cholelithiasis   . SVD (spontaneous vaginal delivery) 03/08/2016  . Pre-eclampsia 03/07/2016  . Pre-diabetes 10/22/2010  . Goiter 10/22/2010    Past Surgical History:  Procedure Laterality Date  . CHOLECYSTECTOMY N/A 05/29/2016   Procedure: LAPAROSCOPIC CHOLECYSTECTOMY;  Surgeon: Ralene Ok, MD;  Location: [facility];  Service: General;  Laterality: N/A;  . ENDOSCOPIC RETROGRADE CHOLANGIOPANCREATOGRAPHY (ERCP) WITH PROPOFOL N/A 05/28/2016   Procedure: ENDOSCOPIC RETROGRADE CHOLANGIOPANCREATOGRAPHY (ERCP) WITH PROPOFOL;  Surgeon: Carol Ada, MD;  Location: [facility];  Service: Endoscopy;  Laterality: N/A;  .  FOOT SURGERY    . THERAPEUTIC ABORTION       OB History    Gravida  4   Para  1   Term  1   Preterm      AB  2   Living  1     SAB      TAB  2   Ectopic      Multiple  0   Live Births  1            Home Medications    Prior to Admission medications   Medication Sig Start Date End Date Taking? Authorizing Provider  APAP-Pamabrom-Pyrilamine (PAMPRIN MAX PAIN FORMULA) 500-25-15 MG TABS Take 2 tablets by mouth daily as needed (cramps).    [provider]  Levonorgestrel (KYLEENA) 19.5 MG IUD by Intrauterine route once. Implanted May 02, 2016    [provider]  NIFEdipine (PROCARDIA XL/ADALAT-CC) 60 MG 24 hr tablet Take 1 tablet (60 mg total) by mouth daily. Patient not taking: Reported on 05/27/2016 03/10/16   Prothero, Pleas Koch, CNM  oxyCODONE-acetaminophen (PERCOCET) 5-325 MG tablet Take 1 tablet by mouth every 4 (four) hours as needed for severe pain. 05/30/16   Focht, Fraser Din, PA  oxyCODONE-acetaminophen (ROXICET) 5-325 MG tablet Take 1-2 tablets by mouth every 4 (four) hours as needed. 05/29/16   Ralene Ok, MD  Prenatal Vit-Fe Fumarate-FA (PRENATAL MULTIVITAMIN) TABS tablet Take 1 tablet by mouth daily at 12 noon. Patient not taking: Reported on 05/27/2016 03/10/16   Prothero, Pleas Koch, CNM    Family History History reviewed. No pertinent  family history.  Social History Social History   Tobacco Use  . Smoking status: Never Smoker  . Smokeless tobacco: Never Used  Substance Use Topics  . Alcohol use: No  . Drug use: No     Allergies   Iodine; Latex; and Peanut-containing drug products   Review of Systems Review of Systems  All other systems reviewed and are negative.   Physical Exam Updated Vital Signs BP 138/85 (BP Location: Right Arm)   Pulse 78   Temp 98.7 F (37.1 C) (Oral)   Resp 18   Ht 5\' 8"  (1.727 m)   Wt 69.4 kg   LMP 07/20/2018   SpO2 100%   BMI 23.26 kg/m   Physical Exam Vitals signs and  nursing note reviewed.  Constitutional:      Appearance: She is well-developed.  HENT:     Head: Normocephalic and atraumatic.  Eyes:     General: No scleral icterus.       Right eye: No discharge.        Left eye: No discharge.     Conjunctiva/sclera: Conjunctivae normal.     Pupils: Pupils are equal, round, and reactive to light.  Neck:     Musculoskeletal: Normal range of motion.     Vascular: No JVD.     Trachea: No tracheal deviation.  Pulmonary:     Effort: Pulmonary effort is normal.     Breath sounds: No stridor.  Neurological:     Mental Status: She is alert and oriented to person, place, and time.     Coordination: Coordination normal.  Psychiatric:        Behavior: Behavior normal.        Thought Content: Thought content normal.        Judgment: Judgment normal.      ED Treatments / Results  Labs (all labs ordered are listed, but only abnormal results are displayed) Labs Reviewed  I-STAT BETA HCG BLOOD, ED ([facility], [facility], [facility] ONLY) - Abnormal; Notable for the following components:      Result Value   I-stat hCG, quantitative 42.1 (*)    All other components within normal limits  CBC WITH DIFFERENTIAL/PLATELET  BASIC METABOLIC PANEL  URINALYSIS, ROUTINE W REFLEX MICROSCOPIC    EKG None  Radiology No results found.  Procedures Procedures (including critical care time)  Medications Ordered in ED Medications - No data to display   Initial Impression / Assessment and Plan / ED Course  I have reviewed the triage vital signs and the nursing notes.  Pertinent labs & imaging results that were available during my care of the patient were reviewed by me and considered in my medical decision making (see chart for details).        23 year old female presents today with vaginal bleeding after medical abortion.  She is well-appearing no acute distress.  Her hCG still elevated.  Patient will be transferred [facility] for further evaluation and management.     Final  Clinical Impressions(s) / ED Diagnoses   Final diagnoses:  Vaginal bleeding    ED Discharge Orders    None       Francee Gentile 10/14/18 1506    Little, Wenda Overland, MD 10/14/18 2249

## 2018-10-15 LAB — GC/CHLAMYDIA PROBE AMP (~~LOC~~) NOT AT ARMC
Chlamydia: NEGATIVE
Neisseria Gonorrhea: NEGATIVE

## 2018-10-16 ENCOUNTER — Telehealth: Payer: Self-pay | Admitting: Family Medicine

## 2018-10-16 NOTE — Telephone Encounter (Signed)
Spoke with patient about her appointment on 6/15 @ 11:00. Patient was instructed to wear a face mask for the entire appointment and no visitors are allowed. Patient was screened for covid-19 symptoms and denied having any.

## 2018-10-19 ENCOUNTER — Other Ambulatory Visit: Payer: Self-pay

## 2018-10-19 ENCOUNTER — Ambulatory Visit: Payer: Federal, State, Local not specified - PPO | Admitting: Obstetrics & Gynecology

## 2018-10-19 DIAGNOSIS — O034 Incomplete spontaneous abortion without complication: Secondary | ICD-10-CM

## 2018-10-20 ENCOUNTER — Telehealth: Payer: Self-pay

## 2018-10-20 LAB — BETA HCG QUANT (REF LAB): hCG Quant: 15 m[IU]/mL

## 2018-10-20 NOTE — Telephone Encounter (Addendum)
-----   Message from Woodroe Mode, MD sent at 10/20/2018  2:37 PM EDT ----- Decrease c/w miscarriage. Please repeat one week  Notified pt provider recommendation.  Pt stated that she will be able to come in on 10/26/18 @ 0930 for a non-stat beta.  [facility] office notified.

## 2018-10-23 ENCOUNTER — Other Ambulatory Visit: Payer: Self-pay | Admitting: *Deleted

## 2018-10-23 ENCOUNTER — Telehealth: Payer: Self-pay | Admitting: Family Medicine

## 2018-10-23 DIAGNOSIS — O039 Complete or unspecified spontaneous abortion without complication: Secondary | ICD-10-CM

## 2018-10-23 NOTE — Telephone Encounter (Signed)
Attempted to call patient about her appointment on 6/22 @ 10:30. No answer left detailed voicemail instructing the patient to wear a face mask for the entire appointment and no visitors are allowed. Patient instructed that if she has any symptoms to not come to the appointment and give the office a call to be rescheduled. Office number and list of symptoms were left.

## 2018-10-26 ENCOUNTER — Other Ambulatory Visit: Payer: Self-pay

## 2018-10-26 ENCOUNTER — Telehealth: Payer: Federal, State, Local not specified - PPO | Admitting: Advanced Practice Midwife

## 2018-10-26 ENCOUNTER — Other Ambulatory Visit: Payer: Federal, State, Local not specified - PPO

## 2018-10-26 DIAGNOSIS — Z3009 Encounter for other general counseling and advice on contraception: Secondary | ICD-10-CM

## 2018-10-26 DIAGNOSIS — O039 Complete or unspecified spontaneous abortion without complication: Secondary | ICD-10-CM

## 2018-10-26 NOTE — Progress Notes (Signed)
Patient did not answer for her visit today.   Marcille Buffy DNP, CNM  10/26/18  4:04 PM

## 2018-10-26 NOTE — Progress Notes (Signed)
@  400pm no answer LVM to call back if needed appointment

## 2018-10-27 ENCOUNTER — Telehealth: Payer: Self-pay | Admitting: Family Medicine

## 2018-10-27 LAB — BETA HCG QUANT (REF LAB): hCG Quant: 2 m[IU]/mL

## 2018-10-27 NOTE — Telephone Encounter (Signed)
Patient called and said she need a RX for her Birth Control pills  [facility] on [address]

## 2018-10-28 NOTE — Telephone Encounter (Signed)
Per chart review had miscarriage , had virtual visit for follow up/ birthcontrol that she missed.  Called patient and left message we are returning your call - you need to call our office to reschedule virtual visit before rx can be prescribed.

## 2018-11-17 ENCOUNTER — Ambulatory Visit: Payer: Federal, State, Local not specified - PPO

## 2018-11-19 ENCOUNTER — Other Ambulatory Visit: Payer: Self-pay

## 2018-11-19 ENCOUNTER — Encounter: Payer: Self-pay | Admitting: Medical

## 2018-11-19 ENCOUNTER — Telehealth (INDEPENDENT_AMBULATORY_CARE_PROVIDER_SITE_OTHER): Payer: Federal, State, Local not specified - PPO | Admitting: Medical

## 2018-11-19 ENCOUNTER — Ambulatory Visit: Payer: Federal, State, Local not specified - PPO | Admitting: Medical

## 2018-11-19 DIAGNOSIS — Z3009 Encounter for other general counseling and advice on contraception: Secondary | ICD-10-CM

## 2018-11-19 MED ORDER — NORETHIN ACE-ETH ESTRAD-FE 1-20 MG-MCG PO TABS: 1.0000 | ORAL_TABLET | Freq: Every day | ORAL | 11 refills | Status: AC

## 2018-11-19 NOTE — Progress Notes (Signed)
I connected with Destiny Stuart on 08/11/18 at  9:15 AM EDT by: MyChart and verified that I am speaking with the correct person using two identifiers.  Patient is located at home and provider is located at [facility].     The purpose of this virtual visit is to provide medical care while limiting exposure to the novel coronavirus. I discussed the limitations, risks, security and privacy concerns of performing an evaluation and management service by MyChart and the availability of in person appointments. I also discussed with the patient that there may be a patient responsible charge related to this service. By engaging in this virtual visit, you consent to the provision of healthcare.  Additionally, you authorize for your insurance to be billed for the services provided during this visit.  The patient expressed understanding and agreed to proceed.  History:  Ms. Destiny Stuart is a 23 y.o. [phone] who has a video visit today for birth control counseling. The patient states that she was previously on OCPs (Junel) prior to having her son. She tolerated the medication well without major side effects. She is not currently breastfeeding. She denies any history of hypertension. She states that she started her first period after her recent miscarriage yesterday.   The following portions of the patient's history were reviewed and updated as appropriate: allergies, current medications, family history, past medical history, social history, past surgical history and problem list.  Review of Systems:  Review of Systems  Constitutional: Negative for fever.  Gastrointestinal: Negative for abdominal pain.  Genitourinary:       + vaginal bleeding      Objective:  Physical Exam LMP 07/20/2018 Comment: SAB  Breastfeeding Unknown  Physical Exam  Constitutional: She is oriented to person, place, and time. She appears well-developed and well-nourished. No distress.  Respiratory: Effort normal.  Neurological:  She is alert and oriented to person, place, and time.    Assessment & Plan:  1. Birth control counseling - Offered to discuss all forms of birth control, however patient knows that she would like to start OCPs   2. Unwanted fertility - norethindrone-ethinyl estradiol (JUNEL FE 1/20) 1-20 MG-MCG tablet; Take 1 tablet by mouth daily.  Dispense: 1 Package; Refill: 76  Advised patient that she will be due for a pap smear this year and she should call back to schedule at a later date.  Patient may return to [facility] as needed otherwise   Destiny Stuart 11/19/2018 2:14 PM

## 2018-11-19 NOTE — Patient Instructions (Signed)

## 2018-11-25 ENCOUNTER — Telehealth: Payer: Self-pay | Admitting: Family Medicine

## 2018-11-25 NOTE — Telephone Encounter (Signed)
Attempted to call patient about her appointment on 7/23 @ 10:20. No answer, left voicemail instructing patient to wear a face mask for the entire appointment and no visitors are allowed during the visit. Patient instructed not to attend the appointment if she was any symptoms. Symptom list and office number left.

## 2018-11-26 ENCOUNTER — Ambulatory Visit (INDEPENDENT_AMBULATORY_CARE_PROVIDER_SITE_OTHER): Payer: Federal, State, Local not specified - PPO | Admitting: General Practice

## 2018-11-26 ENCOUNTER — Other Ambulatory Visit: Payer: Self-pay

## 2018-11-26 DIAGNOSIS — N898 Other specified noninflammatory disorders of vagina: Secondary | ICD-10-CM

## 2018-11-26 DIAGNOSIS — Z113 Encounter for screening for infections with a predominantly sexual mode of transmission: Secondary | ICD-10-CM

## 2018-11-26 DIAGNOSIS — N760 Acute vaginitis: Secondary | ICD-10-CM | POA: Diagnosis not present

## 2018-11-26 DIAGNOSIS — A599 Trichomoniasis, unspecified: Secondary | ICD-10-CM

## 2018-11-26 DIAGNOSIS — B9689 Other specified bacterial agents as the cause of diseases classified elsewhere: Secondary | ICD-10-CM | POA: Diagnosis not present

## 2018-11-26 NOTE — Progress Notes (Addendum)
Patient presents to office today for a test of cure following + trichomonas test 6/10. Patient reports she and her partner have been treated. Patient instructed in self swab and specimen collected. Discussed results will be back in 24-48 hours and we will contact her if anything comes back abnormal. Patient verbalized understanding.  Koren Bound RN BSN 11/26/18   Attestation of Attending Supervision of RN: Evaluation and management procedures were performed by the nurse under my supervision and collaboration.  I have reviewed the nursing note and chart, and I agree with the management and plan.  Carolyn L. Harraway-Smith, M.D., Cherlynn June

## 2018-11-27 LAB — CERVICOVAGINAL ANCILLARY ONLY
Bacterial vaginitis: POSITIVE — AB
Candida vaginitis: POSITIVE — AB
Trichomonas: POSITIVE — AB

## 2018-11-30 ENCOUNTER — Telehealth: Payer: Self-pay

## 2018-11-30 ENCOUNTER — Other Ambulatory Visit: Payer: Self-pay | Admitting: Obstetrics & Gynecology

## 2018-11-30 DIAGNOSIS — B373 Candidiasis of vulva and vagina: Secondary | ICD-10-CM

## 2018-11-30 DIAGNOSIS — B3731 Acute candidiasis of vulva and vagina: Secondary | ICD-10-CM

## 2018-11-30 DIAGNOSIS — A599 Trichomoniasis, unspecified: Secondary | ICD-10-CM

## 2018-11-30 MED ORDER — METRONIDAZOLE 500 MG PO TABS: ORAL_TABLET | ORAL | 0 refills | Status: AC

## 2018-11-30 MED ORDER — FLUCONAZOLE 150 MG PO TABS: 150.0000 mg | ORAL_TABLET | Freq: Once | ORAL | 0 refills | Status: AC

## 2018-11-30 NOTE — Telephone Encounter (Signed)
-----   Message from Lavonia Drafts, MD sent at 11/30/2018  8:50 AM EDT ----- Please call pt. She has trich and Careers information officer. She AND her partner need to be treated. Flagyl 2grams and Diflucan. .  Order in [place]. (NOTE the order is 4 tabs for pt and 4 tabs for partner)!  Thx  clh-S

## 2018-11-30 NOTE — Telephone Encounter (Signed)
Patient called and made aware of yeast infection and trich. Patient states that this was a test of cure for the trich so she knows about the infection. Patient made aware that they need to abstain from intercourse for two weeks. Patient advisied that we sent in medication (4 tabs for her and 4 tabs for her partner) for both of them and they need to both take it on the same day and abstain from intercourse for two. Weeks.  Also explained diflucan has been called in for the yeast infection. Patient states understanding. Kathrene Alu RN

## 2018-11-30 NOTE — Telephone Encounter (Signed)
Left message for patient to return call to office. Jennifer Howard  RN 

## 2018-12-17 ENCOUNTER — Other Ambulatory Visit: Payer: Self-pay

## 2018-12-17 ENCOUNTER — Ambulatory Visit (INDEPENDENT_AMBULATORY_CARE_PROVIDER_SITE_OTHER): Payer: Federal, State, Local not specified - PPO

## 2018-12-17 DIAGNOSIS — Z202 Contact with and (suspected) exposure to infections with a predominantly sexual mode of transmission: Secondary | ICD-10-CM

## 2018-12-17 MED ORDER — DROSPIRENONE-ETHINYL ESTRADIOL 3-0.02 MG PO TABS: 1.0000 | ORAL_TABLET | Freq: Every day | ORAL | 11 refills | Status: AC

## 2018-12-17 NOTE — Progress Notes (Signed)
Patient seen and assessed by nursing staff.  Agree with documentation and plan.  

## 2018-12-17 NOTE — Progress Notes (Signed)
Pt here today for STD testing.  Per chart review pt was tested + Trich on 7/23 and she wants to know if its gone.  I explained to the pt that it was to soon to test and that I recommended that she comes back in a couple weeks.  I asked pt if she and her partner were treated she stated yes.  I also confirmed with pt if she has had intercourse with her partner after dx but before treatment and if they abstained a couple weeks after tx.  Pt stated "no, I have not had sex".  Pt agreed to coming back.  Pt then asks if she can get a different BCP because the one that was prescribed gave her a rash.  Notified Dr. Kennon Rounds who recommended that pt take Yaz.  Notified pt provider's recommendation.  Pt verbalized understanding.   Mel Almond, RN 12/17/18

## 2020-09-20 DIAGNOSIS — D649 Anemia, unspecified: Secondary | ICD-10-CM | POA: Diagnosis not present

## 2020-10-09 IMAGING — US OBSTETRIC <14 WK US AND TRANSVAGINAL OB US
1 series · 15 of 28 positions shown · non-contrast
Comparison: None.

CLINICAL DATA: Status post therapeutic abortion approximately 6
weeks ago. Recurrent pelvic pain and vaginal bleeding. Quantitative
beta hCG of 42.

EXAM:
OBSTETRIC <14 WK US AND TRANSVAGINAL OB US
TECHNIQUE: Both transabdominal and transvaginal ultrasound examinations were
performed for complete evaluation of the gestation as well as the
maternal uterus, adnexal regions, and pelvic cul-de-sac.
Transvaginal technique was performed to assess early pregnancy.

[Series 1: obstetric <14 wk us and transvaginal ob us · 15 of 37 slices shown]
[im 1/37]
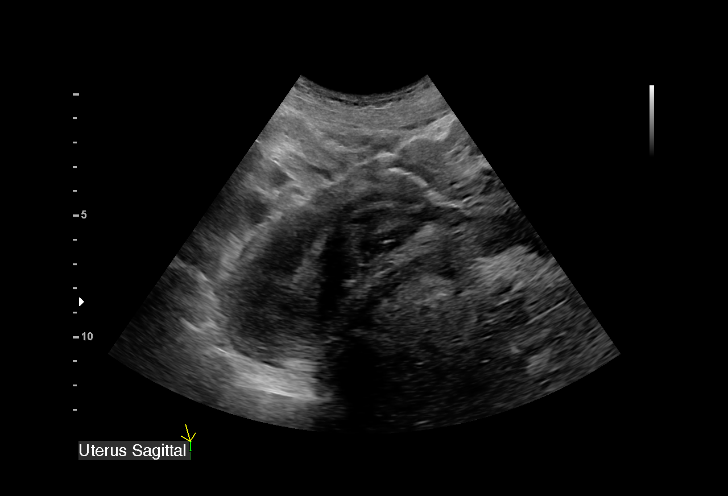
[im 3/37]
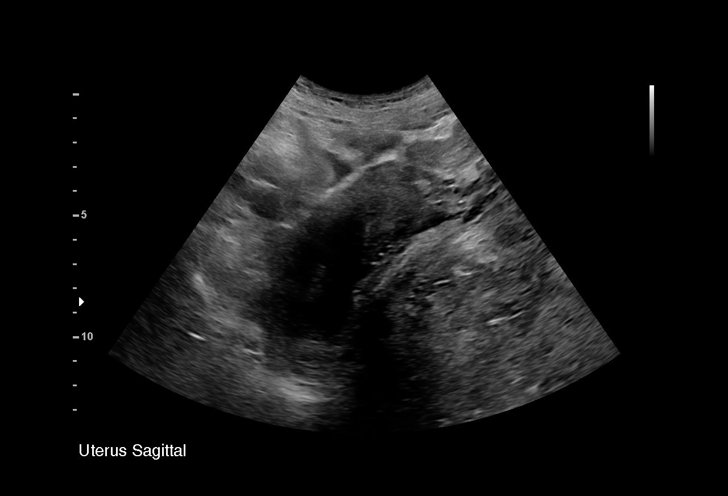
[im 6/37]
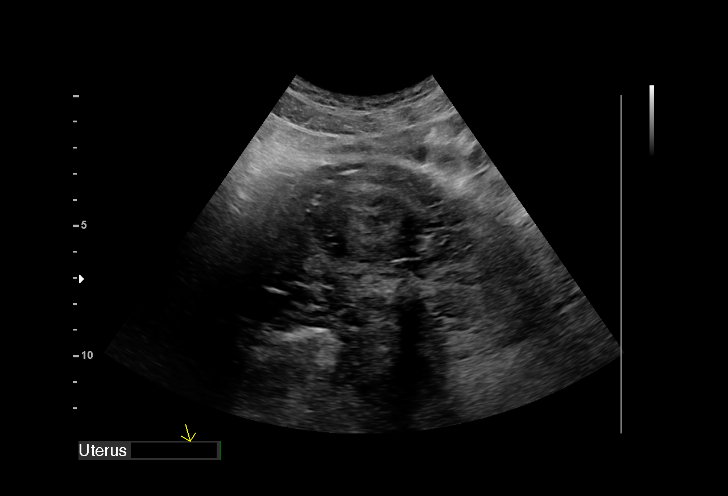
[im 9/37]
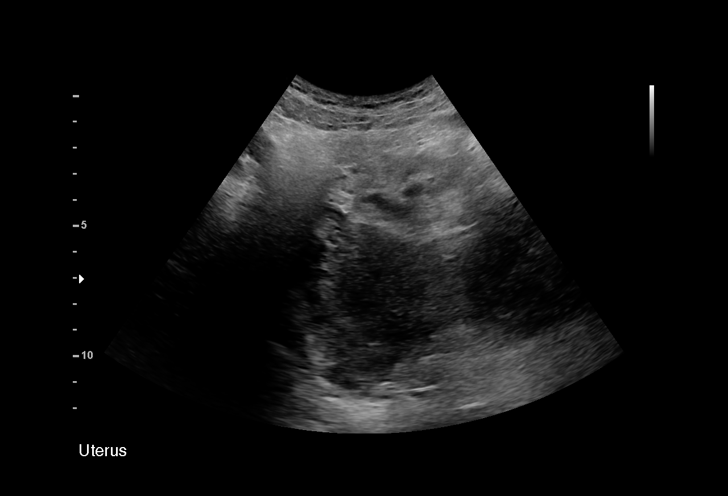
[im 11/37]
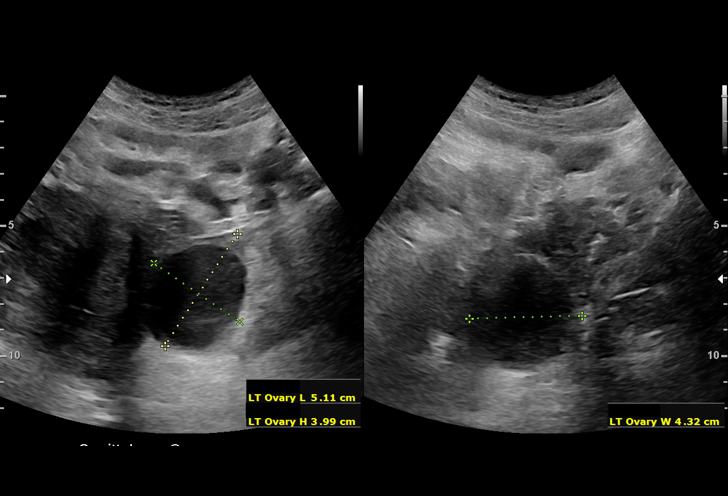
[im 14/37]
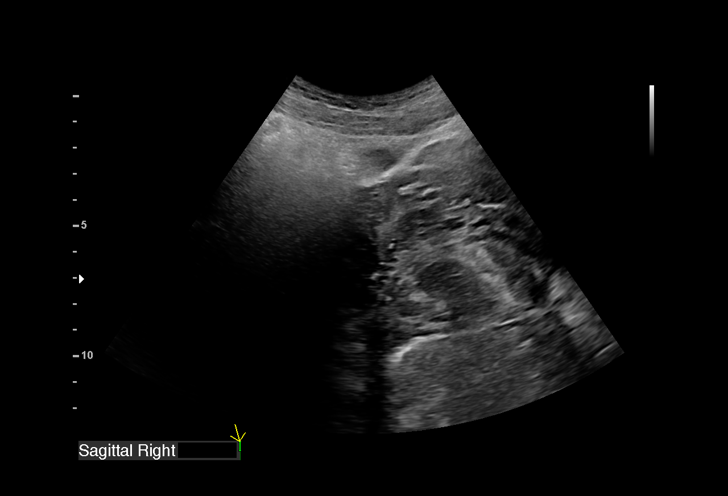
[im 17/37]
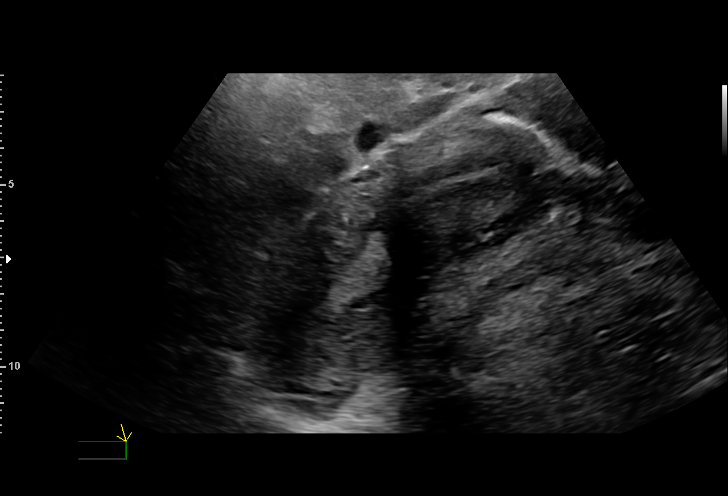
[im 19/37]
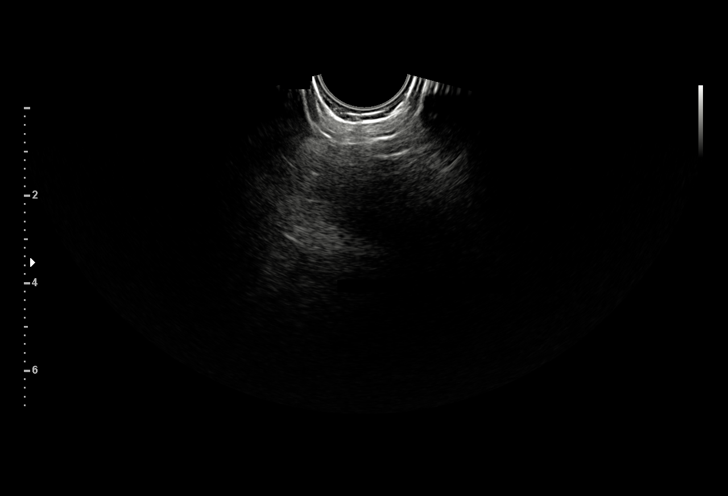
[im 21/37]
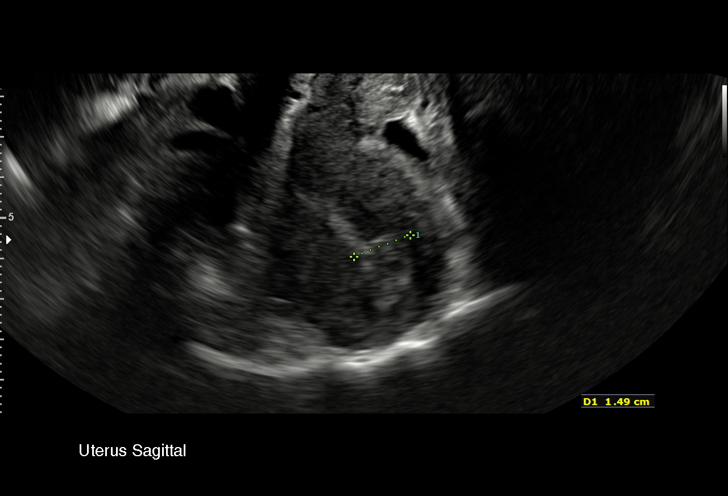
[im 23/37]
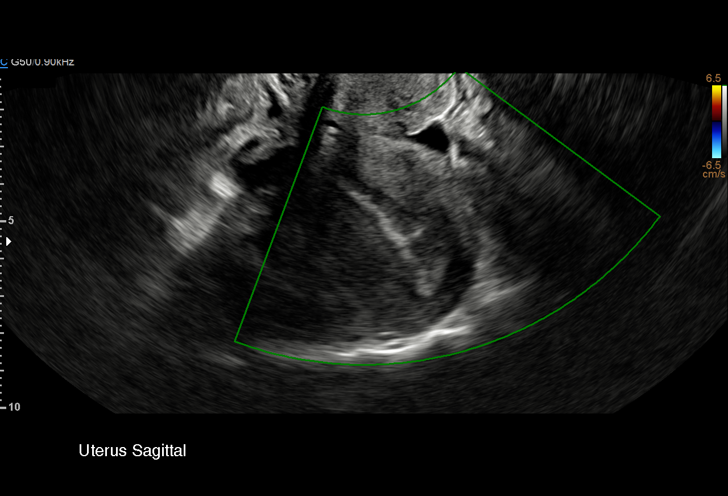
[im 26/37]
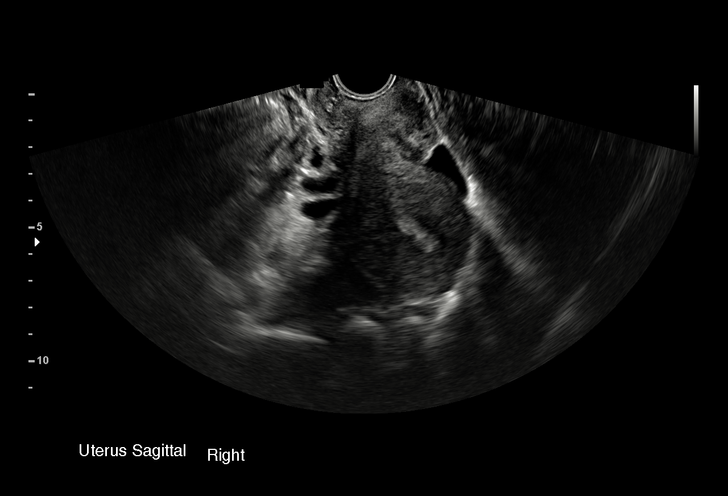
[im 29/37]
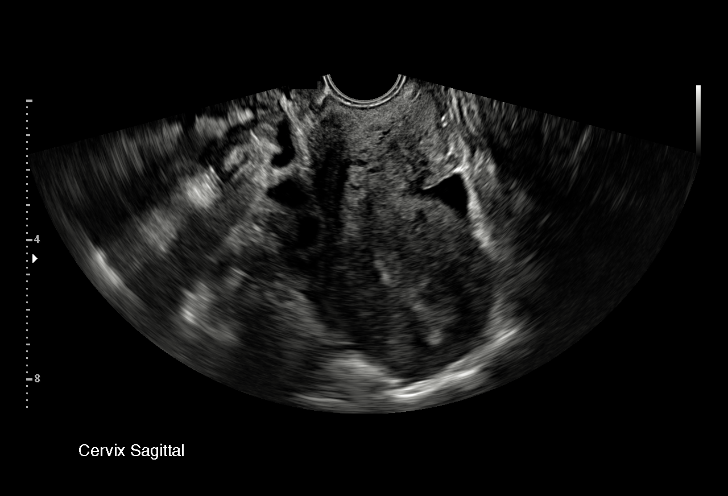
[im 31/37]
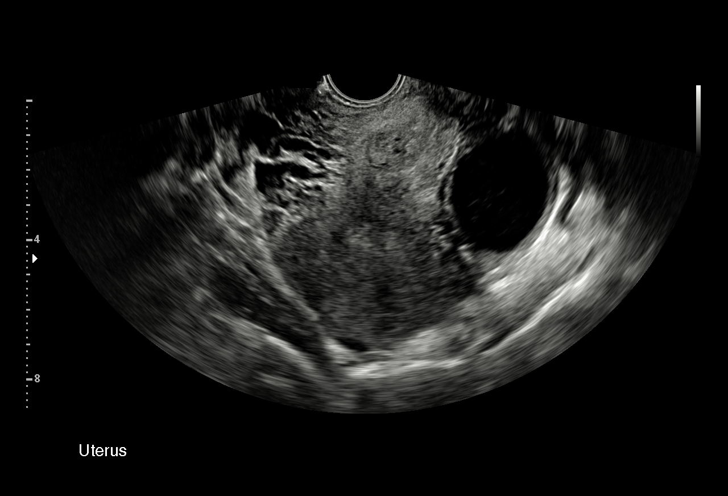
[im 34/37]
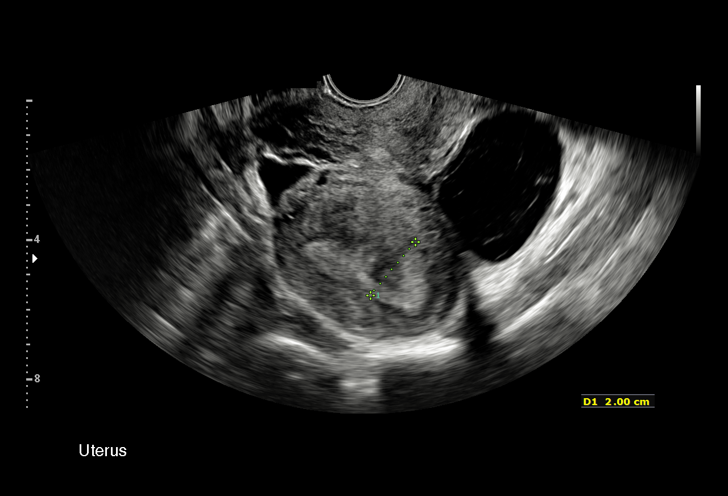
[im 37/37]
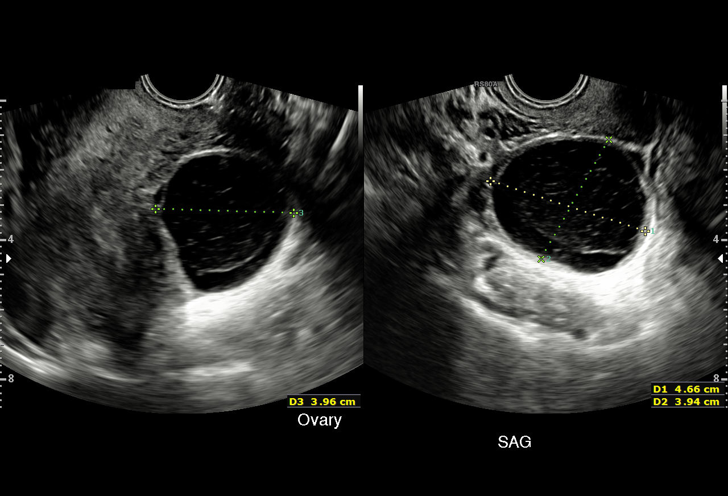

[15 of 28 positions shown; findings below may reference images not displayed]

FINDINGS: Intrauterine gestational sac: None

Maternal uterus/adnexae: Endometrium has heterogeneous appearance
and measures 15 mm in thickness. No internal blood flow seen on
color Doppler ultrasound. No fibroids identified.

A benign-appearing hemorrhagic left ovarian cyst is seen which
measures 4.7 cm. Right ovary was not directly visualized, however no
right adnexal mass identified. No abnormal free-fluid.
IMPRESSION: Endometrial thickness measures 15 mm, but no focal endometrial mass
or vascularity detected. Findings are nonspecific and could indicate
endometrial blood products, although hypovascular retained products
conception cannot be excluded.

4.7 cm benign-appearing hemorrhagic cyst in left ovary.

## 2020-10-19 DIAGNOSIS — Z1332 Encounter for screening for maternal depression: Secondary | ICD-10-CM | POA: Diagnosis not present

## 2020-10-19 DIAGNOSIS — I10 Essential (primary) hypertension: Secondary | ICD-10-CM | POA: Diagnosis not present

## 2020-11-30 DIAGNOSIS — Z0000 Encounter for general adult medical examination without abnormal findings: Secondary | ICD-10-CM | POA: Diagnosis not present

## 2020-11-30 DIAGNOSIS — E119 Type 2 diabetes mellitus without complications: Secondary | ICD-10-CM | POA: Diagnosis not present

## 2021-05-31 DIAGNOSIS — Z3040 Encounter for surveillance of contraceptives, unspecified: Secondary | ICD-10-CM | POA: Diagnosis not present

## 2021-05-31 DIAGNOSIS — Z3202 Encounter for pregnancy test, result negative: Secondary | ICD-10-CM | POA: Diagnosis not present

## 2021-05-31 DIAGNOSIS — Z1389 Encounter for screening for other disorder: Secondary | ICD-10-CM | POA: Diagnosis not present

## 2021-08-24 DIAGNOSIS — J029 Acute pharyngitis, unspecified: Secondary | ICD-10-CM | POA: Diagnosis not present
